# Patient Record
Sex: Male | Born: 1935 | Race: Black or African American | Hispanic: No | Smoking: Light tobacco smoker
Health system: Southern US, Community
[De-identification: ages and names within clinical notes are randomized; demographics above are authoritative.]

## PROBLEM LIST (undated history)

## (undated) DIAGNOSIS — C61 Malignant neoplasm of prostate: Secondary | ICD-10-CM

## (undated) DIAGNOSIS — E119 Type 2 diabetes mellitus without complications: Secondary | ICD-10-CM

## (undated) HISTORY — PX: SP ENDOVASC REPAIR ILIAC ART: HXRAD400

## (undated) HISTORY — PX: ABDOMINAL AORTIC ANEURYSM REPAIR: SHX42

## (undated) HISTORY — PX: INGUINAL HERNIA REPAIR: SUR1180

---

## 2013-08-12 ENCOUNTER — Inpatient Hospital Stay (HOSPITAL_COMMUNITY)
Admission: EM | Admit: 2013-08-12 | Discharge: 2013-08-22 | DRG: 389 | Disposition: A | Discharge status: Skilled Nursing Facility | Payer: Medicare Other | Attending: Oncology | Admitting: Oncology

## 2013-08-12 ENCOUNTER — Emergency Department (HOSPITAL_COMMUNITY): Payer: Medicare Other

## 2013-08-12 ENCOUNTER — Encounter (HOSPITAL_COMMUNITY): Payer: Self-pay | Admitting: Emergency Medicine

## 2013-08-12 DIAGNOSIS — E872 Acidosis, unspecified: Secondary | ICD-10-CM | POA: Diagnosis present

## 2013-08-12 DIAGNOSIS — K56609 Unspecified intestinal obstruction, unspecified as to partial versus complete obstruction: Principal | ICD-10-CM | POA: Diagnosis present

## 2013-08-12 DIAGNOSIS — E876 Hypokalemia: Secondary | ICD-10-CM | POA: Diagnosis not present

## 2013-08-12 DIAGNOSIS — N179 Acute kidney failure, unspecified: Secondary | ICD-10-CM | POA: Diagnosis present

## 2013-08-12 DIAGNOSIS — E119 Type 2 diabetes mellitus without complications: Secondary | ICD-10-CM | POA: Diagnosis present

## 2013-08-12 DIAGNOSIS — C7952 Secondary malignant neoplasm of bone marrow: Secondary | ICD-10-CM

## 2013-08-12 DIAGNOSIS — C61 Malignant neoplasm of prostate: Secondary | ICD-10-CM

## 2013-08-12 DIAGNOSIS — R310 Gross hematuria: Secondary | ICD-10-CM | POA: Diagnosis not present

## 2013-08-12 DIAGNOSIS — E860 Dehydration: Secondary | ICD-10-CM | POA: Diagnosis present

## 2013-08-12 DIAGNOSIS — N390 Urinary tract infection, site not specified: Secondary | ICD-10-CM | POA: Diagnosis present

## 2013-08-12 DIAGNOSIS — Y69 Unspecified misadventure during surgical and medical care: Secondary | ICD-10-CM | POA: Diagnosis not present

## 2013-08-12 DIAGNOSIS — Z881 Allergy status to other antibiotic agents status: Secondary | ICD-10-CM

## 2013-08-12 DIAGNOSIS — R196 Halitosis: Secondary | ICD-10-CM

## 2013-08-12 DIAGNOSIS — R000 Tachycardia, unspecified: Secondary | ICD-10-CM | POA: Diagnosis present

## 2013-08-12 DIAGNOSIS — T8389XA Other specified complication of genitourinary prosthetic devices, implants and grafts, initial encounter: Secondary | ICD-10-CM | POA: Diagnosis not present

## 2013-08-12 DIAGNOSIS — C7951 Secondary malignant neoplasm of bone: Secondary | ICD-10-CM | POA: Diagnosis present

## 2013-08-12 DIAGNOSIS — F17200 Nicotine dependence, unspecified, uncomplicated: Secondary | ICD-10-CM | POA: Diagnosis present

## 2013-08-12 DIAGNOSIS — R5381 Other malaise: Secondary | ICD-10-CM | POA: Diagnosis present

## 2013-08-12 DIAGNOSIS — D649 Anemia, unspecified: Secondary | ICD-10-CM | POA: Diagnosis present

## 2013-08-12 HISTORY — DX: Type 2 diabetes mellitus without complications: E11.9

## 2013-08-12 HISTORY — DX: Malignant neoplasm of prostate: C61

## 2013-08-12 LAB — CBC WITH DIFFERENTIAL/PLATELET
BASOS ABS: 0 10*3/uL (ref 0.0–0.1)
Basophils Relative: 0 % (ref 0–1)
EOS ABS: 0.2 10*3/uL (ref 0.0–0.7)
Eosinophils Relative: 3 % (ref 0–5)
HCT: 38.1 % — ABNORMAL LOW (ref 39.0–52.0)
Hemoglobin: 12.2 g/dL — ABNORMAL LOW (ref 13.0–17.0)
LYMPHS ABS: 1 10*3/uL (ref 0.7–4.0)
LYMPHS PCT: 16 % (ref 12–46)
MCH: 26.3 pg (ref 26.0–34.0)
MCHC: 32 g/dL (ref 30.0–36.0)
MCV: 82.1 fL (ref 78.0–100.0)
Monocytes Absolute: 0.4 10*3/uL (ref 0.1–1.0)
Monocytes Relative: 7 % (ref 3–12)
NEUTROS PCT: 74 % (ref 43–77)
Neutro Abs: 4.5 10*3/uL (ref 1.7–7.7)
Platelets: 405 10*3/uL — ABNORMAL HIGH (ref 150–400)
RBC: 4.64 MIL/uL (ref 4.22–5.81)
RDW: 16.7 % — AB (ref 11.5–15.5)
WBC: 6 10*3/uL (ref 4.0–10.5)

## 2013-08-12 LAB — URINALYSIS, ROUTINE W REFLEX MICROSCOPIC
GLUCOSE, UA: NEGATIVE mg/dL
Ketones, ur: 15 mg/dL — AB
Nitrite: NEGATIVE
PH: 5 (ref 5.0–8.0)
PROTEIN: 100 mg/dL — AB
SPECIFIC GRAVITY, URINE: 1.026 (ref 1.005–1.030)
Urobilinogen, UA: 1 mg/dL (ref 0.0–1.0)

## 2013-08-12 LAB — COMPREHENSIVE METABOLIC PANEL
ALBUMIN: 2.7 g/dL — AB (ref 3.5–5.2)
ALK PHOS: 335 U/L — AB (ref 39–117)
ALT: 6 U/L (ref 0–53)
AST: 27 U/L (ref 0–37)
BUN: 20 mg/dL (ref 6–23)
CO2: 22 mEq/L (ref 19–32)
Calcium: 9.2 mg/dL (ref 8.4–10.5)
Chloride: 99 mEq/L (ref 96–112)
Creatinine, Ser: 1.22 mg/dL (ref 0.50–1.35)
GFR calc non Af Amer: 50 mL/min — ABNORMAL LOW (ref >90)
GFR, EST AFRICAN AMERICAN: 57 mL/min — AB (ref >90)
GLUCOSE: 140 mg/dL — AB (ref 70–99)
POTASSIUM: 3.6 meq/L — AB (ref 3.7–5.3)
Sodium: 141 mEq/L (ref 137–147)
Total Bilirubin: 0.4 mg/dL (ref 0.3–1.2)
Total Protein: 7.9 g/dL (ref 6.0–8.3)

## 2013-08-12 LAB — I-STAT CG4 LACTIC ACID, ED: Lactic Acid, Venous: 2.36 mmol/L — ABNORMAL HIGH (ref 0.5–2.2)

## 2013-08-12 LAB — URINE MICROSCOPIC-ADD ON

## 2013-08-12 LAB — I-STAT TROPONIN, ED: Troponin i, poc: 0.02 ng/mL (ref 0.00–0.08)

## 2013-08-12 LAB — TYPE AND SCREEN
ABO/RH(D): A POS
Antibody Screen: NEGATIVE

## 2013-08-12 LAB — ABO/RH: ABO/RH(D): A POS

## 2013-08-12 LAB — PROTIME-INR
INR: 1.24 (ref 0.00–1.49)
PROTHROMBIN TIME: 15.3 s — AB (ref 11.6–15.2)

## 2013-08-12 LAB — POC OCCULT BLOOD, ED: FECAL OCCULT BLD: NEGATIVE

## 2013-08-12 LAB — APTT: aPTT: 46 seconds — ABNORMAL HIGH (ref 24–37)

## 2013-08-12 MED ORDER — IOHEXOL 300 MG/ML  SOLN
25.0000 mL | INTRAMUSCULAR | Status: AC
  Administered 2013-08-12: 25 mL via ORAL

## 2013-08-12 MED ORDER — DEXTROSE 5 % IV SOLN
1.0000 g | INTRAVENOUS | Status: DC
  Administered 2013-08-13 – 2013-08-15 (×3): 1 g via INTRAVENOUS
  Filled 2013-08-12 (×4): qty 10

## 2013-08-12 MED ORDER — ONDANSETRON HCL 4 MG/2ML IJ SOLN
4.0000 mg | Freq: Four times a day (QID) | INTRAMUSCULAR | Status: DC | PRN
  Administered 2013-08-18: 4 mg via INTRAVENOUS
  Filled 2013-08-12: qty 2

## 2013-08-12 MED ORDER — IOHEXOL 300 MG/ML  SOLN
80.0000 mL | Freq: Once | INTRAMUSCULAR | Status: AC | PRN
  Administered 2013-08-12: 80 mL via INTRAVENOUS

## 2013-08-12 MED ORDER — HEPARIN SODIUM (PORCINE) 5000 UNIT/ML IJ SOLN
5000.0000 [IU] | Freq: Three times a day (TID) | INTRAMUSCULAR | Status: DC
  Administered 2013-08-12 – 2013-08-22 (×29): 5000 [IU] via SUBCUTANEOUS
  Filled 2013-08-12 (×32): qty 1

## 2013-08-12 MED ORDER — CEFTRIAXONE SODIUM 1 G IJ SOLR
1.0000 g | Freq: Once | INTRAMUSCULAR | Status: AC
  Administered 2013-08-12: 1 g via INTRAVENOUS
  Filled 2013-08-12: qty 10

## 2013-08-12 MED ORDER — SODIUM CHLORIDE 0.9 % IV SOLN
INTRAVENOUS | Status: AC
  Administered 2013-08-12: 100 mL/h via INTRAVENOUS

## 2013-08-12 MED ORDER — SODIUM CHLORIDE 0.9 % IV BOLUS (SEPSIS)
500.0000 mL | Freq: Once | INTRAVENOUS | Status: AC
  Administered 2013-08-12: 500 mL via INTRAVENOUS

## 2013-08-12 MED ORDER — SODIUM CHLORIDE 0.9 % IJ SOLN
3.0000 mL | Freq: Two times a day (BID) | INTRAMUSCULAR | Status: DC
  Administered 2013-08-13 – 2013-08-22 (×9): 3 mL via INTRAVENOUS

## 2013-08-12 MED ORDER — FENTANYL CITRATE 0.05 MG/ML IJ SOLN: 12.5000 ug | INTRAMUSCULAR | Status: DC | PRN

## 2013-08-12 NOTE — ED Notes (Signed)
Admit Doctor at bedside.  

## 2013-08-12 NOTE — ED Provider Notes (Signed)
CSN: [id number]     Arrival date & time 08/12/13  1012 History   First MD Initiated Contact with Patient 08/12/13 1013     Chief Complaint  Patient presents with  . Emesis  . Fatigue     (Consider location/radiation/quality/duration/timing/severity/associated sxs/prior Treatment) The history is provided by the patient and medical records.   [name] with past medical history significant for diabetes and prostate cancer currently on Lupron and PO chemotherapy, presenting to the ED for fatigue and emesis. Per family, the patient has not been eating over the last day and has had one episode of dark brown emesis with concern for bleeding. Family notes he has also appeared fatigued. Patient states he does feel generally weak and has no energy to move around. He states he does not have an appetite.  He denies any chest pain, SOB, palpitations, dizziness, numbness/paresthesias.  No abdominal pain.  No recent medication changes.  No cough, sore throat, fevers, chills, or other URI symptoms.  Per pt he does not have a living will (eldest son handles most of his affairs) and desires to be full code.  Past Medical History  Diagnosis Date  . Diabetes mellitus without complication   . Prostate cancer    History reviewed. No pertinent past surgical history. No family history on file. History  Substance Use Topics  . Smoking status: Light Tobacco Smoker  . Smokeless tobacco: Not on file  . Alcohol Use: No    Review of Systems  Constitutional: Positive for fatigue.  Gastrointestinal: Positive for vomiting.  All other systems reviewed and are negative.     Allergies  Review of patient's allergies indicates no known allergies.  Home Medications   Prior to Admission medications   Not on File   BP 119/72  Temp(Src) 97.5 F (36.4 C) (Rectal)  Resp 25  Ht 5\' 7"  (1.702 m)  Wt 130 lb (58.968 kg)  BMI 20.36 kg/m2  SpO2 100%  Physical Exam  Nursing note and vitals  reviewed. Constitutional: He is oriented to person, place, and time. No distress.  Thin, elderly  HENT:  Head: Normocephalic and atraumatic.  Mouth/Throat: Uvula is midline and oropharynx is clear and moist. Mucous membranes are dry. No oropharyngeal exudate, posterior oropharyngeal edema, posterior oropharyngeal erythema or tonsillar abscesses.  Mucous membranes dry; no blood present in oropharynx  Eyes: Conjunctivae and EOM are normal. Pupils are equal, round, and reactive to light.  Neck: Normal range of motion. Neck supple.  Cardiovascular: Normal rate, regular rhythm and normal heart sounds.   Pulmonary/Chest: Effort normal and breath sounds normal. No respiratory distress. He has no wheezes.  Abdominal: Soft. Bowel sounds are normal. There is no tenderness. There is no guarding.  Genitourinary: Rectum normal. Guaiac negative stool.  Indwelling foley; yellow urine in leg bag Normal rectal tone; light brown stool on glove without gross blood; no external hemorrhoids or masses; guaiac negative  Musculoskeletal: Normal range of motion. He exhibits no edema.  Neurological: He is alert and oriented to person, place, and time.  Skin: Skin is warm and dry. He is not diaphoretic.  Psychiatric: He has a normal mood and affect.    ED Course  Procedures (including critical care time) Labs Review Labs Reviewed  PROTIME-INR - Abnormal; Notable for the following:    Prothrombin Time 15.3 (*)    All other components within normal limits  APTT - Abnormal; Notable for the following:    aPTT 46 (*)  All other components within normal limits  CBC WITH DIFFERENTIAL - Abnormal; Notable for the following:    Hemoglobin 12.2 (*)    HCT 38.1 (*)    RDW 16.7 (*)    Platelets 405 (*)    All other components within normal limits  COMPREHENSIVE METABOLIC PANEL - Abnormal; Notable for the following:    Potassium 3.6 (*)    Glucose, Bld 140 (*)    Albumin 2.7 (*)    Alkaline Phosphatase 335 (*)     GFR calc non Af Amer 50 (*)    GFR calc Af Amer 57 (*)    All other components within normal limits  URINALYSIS, ROUTINE W REFLEX MICROSCOPIC - Abnormal; Notable for the following:    Color, Urine AMBER (*)    APPearance TURBID (*)    Hgb urine dipstick LARGE (*)    Bilirubin Urine SMALL (*)    Ketones, ur 15 (*)    Protein, ur 100 (*)    Leukocytes, UA MODERATE (*)    All other components within normal limits  URINE MICROSCOPIC-ADD ON - Abnormal; Notable for the following:    Bacteria, UA MANY (*)    Casts HYALINE CASTS (*)    All other components within normal limits  I-STAT CG4 LACTIC ACID, ED - Abnormal; Notable for the following:    Lactic Acid, Venous 2.36 (*)    All other components within normal limits  URINE CULTURE  POC OCCULT BLOOD, ED  I-STAT TROPOININ, ED  TYPE AND SCREEN  ABO/RH    Imaging Review Dg Chest 2 View  08/12/2013   CLINICAL DATA:  Emesis and fatigue.  EXAM: CHEST  2 VIEW  COMPARISON:  None.  FINDINGS: Evaluation of the mediastinum is mildly limited by patient rotation. The cardiac silhouette does not appear enlarged. Thoracic aortic calcification is noted. The lungs are hypoinflated with predominantly linear opacities in the lung bases. A few small nodular opacities are present in both lungs. The most prominent rounded opacity is in the right lung base and could represent nipple shadow. There is blunting of the right costophrenic angle, and a small right pleural effusion is not excluded. No pneumothorax is identified.  There is a large air-fluid level under the diaphragm in the left upper quadrant. There is also mild gaseous distension of multiple bowel loops in the upper abdomen, incompletely imaged. Aortic stent graft is partially visualized. No acute osseous abnormality is identified.  IMPRESSION: 1. Hypoinflation with bibasilar lung opacities, most likely reflecting subsegmental atelectasis although developing infection is not excluded. 2. Scattered, nodular  lung opacities. Follow-up formal upright PA and lateral radiographs are suggested when the patient's acute illness has improved and he is able to take a larger inspiration, with consideration for subsequent chest CT. 3. Large air-fluid level in the left upper quadrant, favored to reflect distended stomach, with adjacent mildly distended bowel loops. Dedicated abdominal radiographs are recommended.   Electronically Signed   By: Logan Bores   On: 08/12/2013 11:30   Ct Abdomen Pelvis W Contrast  08/12/2013   CLINICAL DATA:  Dilated bowel loops and vomiting. Fatigue. Rule out small bowel obstruction.  EXAM: CT ABDOMEN AND PELVIS WITH CONTRAST  TECHNIQUE: Multidetector CT imaging of the abdomen and pelvis was performed using the standard protocol following bolus administration of intravenous contrast.  CONTRAST:  51mL OMNIPAQUE IOHEXOL 300 MG/ML  SOLN  COMPARISON:  None.  FINDINGS: Mild subpleural ground-glass and reticular opacities are present in the lung bases, which may  reflect atelectasis although a component of interstitial lung disease is also a consideration. Calcified pleural plaques are partially visualized in the posterior lungs bilaterally. There is no pleural effusion. Three-vessel coronary artery calcifications are present.  Enhancement of the liver is mildly heterogeneous without focal abnormality identified, possibly due to contrast timing. The spleen is also minimally heterogeneous in attenuation, also likely related to contrast timing. The gallbladder, adrenal glands, and right kidney are unremarkable. 2 cm left upper pole renal cyst is noted. Additional subcentimeter left renal hypodensities also likely represent cysts but are too small to fully characterize. The pancreas is diffusely atrophic.  There is marked distention of the stomach with a large air-fluid level present. The duodenum does not cross the midline, however this may be due to displacement by the large abdominal aortic aneurysm. The  majority of the small bowel is fluid-filled and dilated, measuring up to 5 cm in diameter. There is a transition to decompressed distal small bowel in the right lower quadrant, although the precise site of transition is not clearly identified. The colon contains a small amount of stool and is largely decompressed. There is a small amount of intraperitoneal free fluid.  No intraperitoneal free air is identified. A Foley catheter is in place. The prostate is diffusely enlarged and heterogeneous with areas of nodular enhancement present. There is been prior endovascular repair of an abdominal aortic aneurysm with an aorto bi-iliac stent graft. The graft is patent. The native aneurysm sac measures 6.6 x 5.9 cm. The native right common iliac artery aneurysm sac measures 4.2 cm in diameter. There has been prior right internal iliac artery embolization. 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 Sclerotic lesions are present diffusely throughout the visualized ribs, spine, and pelvis.  IMPRESSION: 1. Evidence of high-grade small bowel obstruction with transition to decompressed distal small bowel in the right lower quadrant. 2. Small amount of intraperitoneal free fluid. 3. Enlarged, heterogeneous prostate with diffuse sclerotic bone lesions, consistent with metastatic prostate cancer. 4. Partially calcified pleural plaques, suggestive of prior asbestos exposure. These may be the etiology for the nodular opacities seen on recent chest radiographs. 5. 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   Electronically Signed   By: Logan Bores   On: 08/12/2013 13:31     EKG Interpretation None      MDM   Final diagnoses:  Small bowel obstruction  UTI (lower urinary tract infection)  Prostate cancer   EKG sinus tachycardia rate of 111 without ischemic change.  Troponin  is negative.  Chest x-ray with questionable nodular opacities, distended stomach visualized. CT scan was obtained which shows evidence of high-grade small bowel obstruction with transition point in right lower quadrant.  He continues to deny any chest pain, shortness of breath, palpitations, dizziness, weakness, or abdominal pain. He's had no further episodes of vomiting while in the emergency department.  U/a does appear infected with many bacteria, culture pending. Pt given dose of IV rocephin.  Pt was initially tachycardic on arrival, considered PE given active cancer however pt denies chest pain, is not hypoxic and shows no signs of respiratory distress.  HR is responding well to fluids, BP remains stable.  Pt will likely need medical admission with general surgery consult.    Discussed with general surgery who will consult.  NG tube being placed.  Discussed with IM resident, Dr. Michail Sermon  who will evaluate pt in the ED and admit for further management.  3:38 PM Went in to evaluate patient prior to end of shift and noticed that pts HR has now elevated again, now at 118 when was previously responding to fluids.  He continues to be without chest pain or respiratory distress but given his active prostate cancer will obtain chest CTA to r/o PE.  Updated IM resident, Dr. Michail Sermon who agrees with this plan.  Larene Pickett, PA-C 08/12/13 [place], PA-C 08/12/13 [phone]

## 2013-08-12 NOTE — Consult Note (Signed)
Brandon Harrell [birth year]  [id number].    Requesting MD: Dr. Stark Jock Chief Complaint/Reason for Consult: High grade SBO  HPI:  80 y/o AA male with PMH significant for DM and prostate cancer currently on Lupron and PO chemotherapy drug, presenting to [facility] for fatigue and emesis.  The patient notes anorexia, fatigue, weakness, and 2 episode of dark brown emesis.  Not sure if it had blood in it, but non bilious.  He vomited once last night and once this am.  He denies any chest pain, SOB, palpitations, dizziness.  No abdominal pain, trouble urinating or constipation/diarrhea.  No cough, sore throat, fevers, chills, or other URI symptoms.  No sick contacts, denies feeling ill until yesterday when this all started.  He can't remember his last BM, but thinks he's been passing flatus normally.  He doesn't complain of abdominal pain, but hurts when you push on it.  He denies any h/o of bowel obstruction.  PSH positive for Endovascular aortic and iliac stent graph, b/l open inguinal hernia repairs.  Notes weight loss over the last few month (used to be 160lbs, no 130lbs).  Son notes the patient had a procedure where "they cleaned out his bladder so he could urinate" because the prostate cancer has spread to his bladder.   ROS: All systems reviewed and otherwise negative except for as above  No family history on file.  Past Medical History  Diagnosis Date  . Diabetes mellitus without complication   . Prostate cancer     History reviewed. No pertinent past surgical history.  Social History:  reports that he has been smoking.  He does not have any smokeless tobacco history on file. He reports that he does not drink alcohol or use illicit drugs.  Allergies:  Allergies  Allergen Reactions  . Ciprofloxacin Itching     (Not in a hospital admission)  Blood pressure 106/58, pulse 116, temperature 97.5 F (36.4 C), temperature source Rectal, resp. rate 20, height _0  (1.702 m), weight 130 lb (58.968 kg),  SpO2 97.00%. Physical Exam: General: pleasant, WD, appears malnourished, AA male who is laying in bed in NAD HEENT: head is normocephalic, atraumatic.  Sclera are noninjected.  PERRL.  Ears and nose without any masses or lesions.  Mouth is pink and moist.  Dentures are missing. Heart: regular, rate, and rhythm.  +murmur, no gallops, or rubs noted.  Palpable pedal pulses bilaterally Lungs: CTAB, no wheezes, rhonchi, or rales noted.  Respiratory effort nonlabored Abd: soft, moderate tenderness in the RLQ, no rebound or guarding, +BS, no masses, hernias, or organomegaly MS: all 4 extremities are symmetrical with no cyanosis, clubbing, or edema. Skin: warm and dry with no masses, lesions, or rashes Psych: A&Ox3 with an appropriate affect   Results for orders placed during the hospital encounter of 08/12/13 (from the past 48 hour(s))  POC OCCULT BLOOD, ED     Status: None   Collection Time    08/12/13 10:29 AM      Result Value Ref Range   Fecal Occult Bld NEGATIVE  NEGATIVE  PROTIME-INR     Status: Abnormal   Collection Time    08/12/13 10:50 AM      Result Value Ref Range   Prothrombin Time 15.3 (*) 11.6 - 15.2 seconds   INR 1.24  0.00 - 1.49  APTT     Status: Abnormal   Collection Time    08/12/13 10:50 AM      Result Value Ref Range   aPTT 46 (*)  24 - 37 seconds   Comment:            IF BASELINE aPTT IS ELEVATED,     SUGGEST PATIENT RISK ASSESSMENT     BE USED TO DETERMINE APPROPRIATE     ANTICOAGULANT THERAPY.  CBC WITH DIFFERENTIAL     Status: Abnormal   Collection Time    08/12/13 10:50 AM      Result Value Ref Range   WBC 6.0  4.0 - 10.5 K/uL   RBC 4.64  4.22 - 5.81 MIL/uL   Hemoglobin 12.2 (*) 13.0 - 17.0 g/dL   HCT 38.1 (*) 39.0 - 52.0 %   MCV 82.1  78.0 - 100.0 fL   MCH 26.3  26.0 - 34.0 pg   MCHC 32.0  30.0 - 36.0 g/dL   RDW 16.7 (*) 11.5 - 15.5 %   Platelets 405 (*) 150 - 400 K/uL   Neutrophils Relative % 74  43 - 77 %   Neutro Abs 4.5  1.7 - 7.7 K/uL    Lymphocytes Relative 16  12 - 46 %   Lymphs Abs 1.0  0.7 - 4.0 K/uL   Monocytes Relative 7  3 - 12 %   Monocytes Absolute 0.4  0.1 - 1.0 K/uL   Eosinophils Relative 3  0 - 5 %   Eosinophils Absolute 0.2  0.0 - 0.7 K/uL   Basophils Relative 0  0 - 1 %   Basophils Absolute 0.0  0.0 - 0.1 K/uL  COMPREHENSIVE METABOLIC PANEL     Status: Abnormal   Collection Time    08/12/13 10:50 AM      Result Value Ref Range   Sodium 141  137 - 147 mEq/L   Potassium 3.6 (*) 3.7 - 5.3 mEq/L   Chloride 99  96 - 112 mEq/L   CO2 22  19 - 32 mEq/L   Glucose, Bld 140 (*) 70 - 99 mg/dL   BUN 20  6 - 23 mg/dL   Creatinine, Ser 1.22  0.50 - 1.35 mg/dL   Calcium 9.2  8.4 - 10.5 mg/dL   Total Protein 7.9  6.0 - 8.3 g/dL   Albumin 2.7 (*) 3.5 - 5.2 g/dL   AST 27  0 - 37 U/L   ALT 6  0 - 53 U/L   Alkaline Phosphatase 335 (*) 39 - 117 U/L   Total Bilirubin 0.4  0.3 - 1.2 mg/dL   GFR calc non Af Amer 50 (*) >90 mL/min   GFR calc Af Amer 57 (*) >90 mL/min   Comment: (NOTE)     The eGFR has been calculated using the CKD EPI equation.     This calculation has not been validated in all clinical situations.     eGFR's persistently <90 mL/min signify possible Chronic Kidney     Disease.  TYPE AND SCREEN     Status: None   Collection Time    08/12/13 10:50 AM      Result Value Ref Range   ABO/RH(D) A POS     Antibody Screen NEG     Sample Expiration 08/15/2013    I-STAT TROPOININ, ED     Status: None   Collection Time    08/12/13 10:58 AM      Result Value Ref Range   Troponin i, poc 0.02  0.00 - 0.08 ng/mL   Comment 3            Comment: Due to the release kinetics of  cTnI,     a negative result within the first hours     of the onset of symptoms does not rule out     myocardial infarction with certainty.     If myocardial infarction is still suspected,     repeat the test at appropriate intervals.  URINALYSIS, ROUTINE W REFLEX MICROSCOPIC     Status: Abnormal   Collection Time    08/12/13 11:25 AM       Result Value Ref Range   Color, Urine AMBER (*) YELLOW   Comment: BIOCHEMICALS MAY BE AFFECTED BY COLOR   APPearance TURBID (*) CLEAR   Specific Gravity, Urine 1.026  1.005 - 1.030   pH 5.0  5.0 - 8.0   Glucose, UA NEGATIVE  NEGATIVE mg/dL   Hgb urine dipstick LARGE (*) NEGATIVE   Bilirubin Urine SMALL (*) NEGATIVE   Ketones, ur 15 (*) NEGATIVE mg/dL   Protein, ur 100 (*) NEGATIVE mg/dL   Urobilinogen, UA 1.0  0.0 - 1.0 mg/dL   Nitrite NEGATIVE  NEGATIVE   Leukocytes, UA MODERATE (*) NEGATIVE  URINE MICROSCOPIC-ADD ON     Status: Abnormal   Collection Time    08/12/13 11:25 AM      Result Value Ref Range   Squamous Epithelial / LPF RARE  RARE   WBC, UA 11-20  <3 WBC/hpf   RBC / HPF 21-50  <3 RBC/hpf   Bacteria, UA MANY (*) RARE   Casts HYALINE CASTS (*) NEGATIVE  I-STAT CG4 LACTIC ACID, ED     Status: Abnormal   Collection Time    08/12/13  2:32 PM      Result Value Ref Range   Lactic Acid, Venous 2.36 (*) 0.5 - 2.2 mmol/L   Dg Chest 2 View  08/12/2013   CLINICAL DATA:  Emesis and fatigue.  EXAM: CHEST  2 VIEW  COMPARISON:  None.  FINDINGS: Evaluation of the mediastinum is mildly limited by patient rotation. The cardiac silhouette does not appear enlarged. Thoracic aortic calcification is noted. The lungs are hypoinflated with predominantly linear opacities in the lung bases. A few small nodular opacities are present in both lungs. The most prominent rounded opacity is in the right lung base and could represent nipple shadow. There is blunting of the right costophrenic angle, and a small right pleural effusion is not excluded. No pneumothorax is identified.  There is a large air-fluid level under the diaphragm in the left upper quadrant. There is also mild gaseous distension of multiple bowel loops in the upper abdomen, incompletely imaged. Aortic stent graft is partially visualized. No acute osseous abnormality is identified.  IMPRESSION: 1. Hypoinflation with bibasilar lung  opacities, most likely reflecting subsegmental atelectasis although developing infection is not excluded. 2. Scattered, nodular lung opacities. Follow-up formal upright PA and lateral radiographs are suggested when the patient's acute illness has improved and he is able to take a larger inspiration, with consideration for subsequent chest CT. 3. Large air-fluid level in the left upper quadrant, favored to reflect distended stomach, with adjacent mildly distended bowel loops. Dedicated abdominal radiographs are recommended.   Electronically Signed   By: Logan Bores   On: 08/12/2013 11:30   Ct Abdomen Pelvis W Contrast  08/12/2013   CLINICAL DATA:  Dilated bowel loops and vomiting. Fatigue. Rule out small bowel obstruction.  EXAM: CT ABDOMEN AND PELVIS WITH CONTRAST  TECHNIQUE: Multidetector CT imaging of the abdomen and pelvis was performed using the standard protocol following bolus administration of  intravenous contrast.  CONTRAST:  9m OMNIPAQUE IOHEXOL 300 MG/ML  SOLN  COMPARISON:  None.  FINDINGS: Mild subpleural ground-glass and reticular opacities are present in the lung bases, which may reflect atelectasis although a component of interstitial lung disease is also a consideration. Calcified pleural plaques are partially visualized in the posterior lungs bilaterally. There is no pleural effusion. Three-vessel coronary artery calcifications are present.  Enhancement of the liver is mildly heterogeneous without focal abnormality identified, possibly due to contrast timing. The spleen is also minimally heterogeneous in attenuation, also likely related to contrast timing. The gallbladder, adrenal glands, and right kidney are unremarkable. 2 cm left upper pole renal cyst is noted. Additional subcentimeter left renal hypodensities also likely represent cysts but are too small to fully characterize. The pancreas is diffusely atrophic.  There is marked distention of the stomach with a large air-fluid level  present. The duodenum does not cross the midline, however this may be due to displacement by the large abdominal aortic aneurysm. The majority of the small bowel is fluid-filled and dilated, measuring up to 5 cm in diameter. There is a transition to decompressed distal small bowel in the right lower quadrant, although the precise site of transition is not clearly identified. The colon contains a small amount of stool and is largely decompressed. There is a small amount of intraperitoneal free fluid.  No intraperitoneal free air is identified. A Foley catheter is in place. The prostate is diffusely enlarged and heterogeneous with areas of nodular enhancement present. There is been prior endovascular repair of an abdominal aortic aneurysm with an aorto bi-iliac stent graft. The graft is patent. The native aneurysm sac measures 6.6 x 5.9 cm. The native right common iliac artery aneurysm sac measures 4.2 cm in diameter. There has been prior right internal iliac artery embolization. 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 Sclerotic lesions are present diffusely throughout the visualized ribs, spine, and pelvis.  IMPRESSION: 1. Evidence of high-grade small bowel obstruction with transition to decompressed distal small bowel in the right lower quadrant. 2. Small amount of intraperitoneal free fluid. 3. Enlarged, heterogeneous prostate with diffuse sclerotic bone lesions, consistent with metastatic prostate cancer. 4. Partially calcified pleural plaques, suggestive of prior asbestos exposure. These may be the etiology for the nodular opacities seen on recent chest radiographs. 5. 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   Electronically Signed   By: ALogan Bores  On: 08/12/2013 13:31      Assessment/Plan Small bowel obstruction (?high grade) Prostate cancer with  h/o urinary obstruction Diabetes Multiple medical problems to be managed by medicine   Plan: 1.  Admit to Medicine, we will consult, treat conservatively for now, hopefully this will resolve on its own without surgical intervention 2.  Place NG tube, NPO, bowel rest, IVF, pain control, antiemetics 3.  SCD's and DVT proph per medicine 4.  Ambulate and IS to aid in recovery 5.  Son at bedside and patient are agreeable to the plan and wish to avoid surgery if at all possible   MCoralie Keens PProvidence Holy Cross Medical CenterSurgery 08/12/2013, 3:11 PM Pager: [phone]

## 2013-08-12 NOTE — Progress Notes (Signed)
Pt admitted to unit from ED. Pt A&O x3, VS stable, & skin intact. Pt placed on telemetry, oriented to unit, and call bell within reach. Pt currently resting comfortably in bed. Will continue to monitor.

## 2013-08-12 NOTE — H&P (Signed)
Date: 08/12/2013               Patient Name:  Brandon Harrell MRN: [id number]  DOB: 1919/07/14 Age / Sex: 80 y.o., male   PCP: No Pcp Per Patient         Medical Service: Internal Medicine Teaching Service         Attending Physician: Dr. Veryl Speak, MD    First Contact: Dr. [name] Pager: [phone]  Second Contact: Dr. Clinton Gallant Pager: [phone]       After Hours (After 5p/  First Contact Pager: [phone]  weekends / holidays): Second Contact Pager: [phone]   Chief Complaint: decreased po and N/V  History of Present Illness: Brandon Harrell is  80 year old male with a PMH of DM and met prostate CA (on Lupron and Zytiga) who presents with complaint decreased po and episode of dark brown vomiting.  History is provided by the patient and his son.  Has not eaten in about 4 days.  1 episode of dark brown vomiting last night.  His son says he did not complain of abdominal pain.  He denies constipation/diarrhea and says his last BM was yesterday.  He denies F/C, chest pain, dyspnea or dysuria.  In the ED:  T 97.9F, HR 25, SpO2 100%, HR 100-110s, BP 113/49mHg; ceftriaxone and NS given; surgery consulted - NG placed.  Meds: Current Facility-Administered Medications  Medication Dose Route Frequency Provider Last Rate Last Dose  . fentaNYL (SUBLIMAZE) injection 12.5-50 mcg  12.5-50 mcg Intravenous Q3H PRN Megan Dort, PA-C      . ondansetron (ZOFRAN) injection 4 mg  4 mg Intravenous Q6H PRN MCoralie Keens PA-C       Current Outpatient Prescriptions  Medication Sig Dispense Refill  . abiraterone Acetate (ZYTIGA) 250 MG tablet Take 1,000 mg by mouth daily. Take on an empty stomach 1 hour before or 2 hours after a meal      . HYDROcodone-acetaminophen (NORCO/VICODIN) 5-325 MG per tablet Take 1 tablet by mouth 2 (two) times daily as needed for moderate pain.      .Marland Kitchenlevofloxacin (LEVAQUIN) 750 MG tablet Take 750 mg by mouth daily.      .Marland KitchenLORazepam (ATIVAN) 0.5 MG tablet Take 0.5 mg by mouth at bedtime as  needed for anxiety or sleep.      . nateglinide (STARLIX) 60 MG tablet Take 60 mg by mouth 3 (three) times daily with meals.        Allergies: Allergies as of 08/12/2013 - Review Complete 08/12/2013  Allergen Reaction Noted  . Ciprofloxacin Itching 08/12/2013   Past Medical History  Diagnosis Date  . Diabetes mellitus without complication   . Prostate cancer    Past Surgical History  Procedure Laterality Date  . Abdominal aortic aneurysm repair    . Sp endovasc repair iliac art    . Inguinal hernia repair Bilateral     Open   No family history on file. History   Social History  . Marital Status: Widowed    Spouse Name: N/A    Number of Children: N/A  . Years of Education: N/A   Occupational History  . Not on file.   Social History Main Topics  . Smoking status: Light Tobacco Smoker  . Smokeless tobacco: Not on file  . Alcohol Use: No  . Drug Use: No  . Sexual Activity: Not on file   Other Topics Concern  . Not on file   Social History Narrative  .  No narrative on file    Review of Systems: Pertinent items are noted in HPI.  Physical Exam: Blood pressure 102/55, pulse 117, temperature 97.5 F (36.4 C), temperature source Rectal, resp. rate 20, height 5' 7" (1.702 m), weight 130 lb (58.968 kg), SpO2 97.00%. General: resting in bed in NAD HEENT: left pupil < right, round and reactive to light, EOMI, oropharynx dry, NG tube in place with red/brownish drainage Cardiac: RRR, no rubs, murmurs or gallops Pulm: clear to auscultation bilaterally, moving normal volumes of air Abd: soft, nondistended, hyperactive BS LUQ and LLQ, decreased BS RUQ and RLQ Ext: warm and well perfused, no pedal edema, 2+ DPs Neuro: alert and oriented X3, responding appropriately, cranial nerves II-XII grossly intact, muscle strength equal and intact  Lab results: Basic Metabolic Panel:  Recent Labs  08/12/13 1050  NA 141  K 3.6*  CL 99  CO2 22  GLUCOSE 140*  BUN 20  CREATININE  1.22  CALCIUM 9.2   Liver Function Tests:  Recent Labs  08/12/13 1050  AST 27  ALT 6  ALKPHOS 335*  BILITOT 0.4  PROT 7.9  ALBUMIN 2.7*   CBC:  Recent Labs  08/12/13 1050  WBC 6.0  NEUTROABS 4.5  HGB 12.2*  HCT 38.1*  MCV 82.1  PLT 405*   Coagulation:  Recent Labs  08/12/13 1050  LABPROT 15.3*  INR 1.24   Urinalysis:  Recent Labs  08/12/13 1125  COLORURINE AMBER*  LABSPEC 1.026  PHURINE 5.0  GLUCOSEU NEGATIVE  HGBUR LARGE*  BILIRUBINUR SMALL*  KETONESUR 15*  PROTEINUR 100*  UROBILINOGEN 1.0  NITRITE NEGATIVE  LEUKOCYTESUR MODERATE*   Imaging results:  Dg Chest 2 View  08/12/2013   CLINICAL DATA:  Emesis and fatigue.  EXAM: CHEST  2 VIEW  COMPARISON:  None.  FINDINGS: Evaluation of the mediastinum is mildly limited by patient rotation. The cardiac silhouette does not appear enlarged. Thoracic aortic calcification is noted. The lungs are hypoinflated with predominantly linear opacities in the lung bases. A few small nodular opacities are present in both lungs. The most prominent rounded opacity is in the right lung base and could represent nipple shadow. There is blunting of the right costophrenic angle, and a small right pleural effusion is not excluded. No pneumothorax is identified.  There is a large air-fluid level under the diaphragm in the left upper quadrant. There is also mild gaseous distension of multiple bowel loops in the upper abdomen, incompletely imaged. Aortic stent graft is partially visualized. No acute osseous abnormality is identified.  IMPRESSION: 1. Hypoinflation with bibasilar lung opacities, most likely reflecting subsegmental atelectasis although developing infection is not excluded. 2. Scattered, nodular lung opacities. Follow-up formal upright PA and lateral radiographs are suggested when the patient's acute illness has improved and he is able to take a larger inspiration, with consideration for subsequent chest CT. 3. Large air-fluid  level in the left upper quadrant, favored to reflect distended stomach, with adjacent mildly distended bowel loops. Dedicated abdominal radiographs are recommended.   Electronically Signed   By: Logan Bores   On: 08/12/2013 11:30   Ct Abdomen Pelvis W Contrast  08/12/2013   CLINICAL DATA:  Dilated bowel loops and vomiting. Fatigue. Rule out small bowel obstruction.  EXAM: CT ABDOMEN AND PELVIS WITH CONTRAST  TECHNIQUE: Multidetector CT imaging of the abdomen and pelvis was performed using the standard protocol following bolus administration of intravenous contrast.  CONTRAST:  38m OMNIPAQUE IOHEXOL 300 MG/ML  SOLN  COMPARISON:  None.  FINDINGS: Mild subpleural ground-glass and reticular opacities are present in the lung bases, which may reflect atelectasis although a component of interstitial lung disease is also a consideration. Calcified pleural plaques are partially visualized in the posterior lungs bilaterally. There is no pleural effusion. Three-vessel coronary artery calcifications are present.  Enhancement of the liver is mildly heterogeneous without focal abnormality identified, possibly due to contrast timing. The spleen is also minimally heterogeneous in attenuation, also likely related to contrast timing. The gallbladder, adrenal glands, and right kidney are unremarkable. 2 cm left upper pole renal cyst is noted. Additional subcentimeter left renal hypodensities also likely represent cysts but are too small to fully characterize. The pancreas is diffusely atrophic.  There is marked distention of the stomach with a large air-fluid level present. The duodenum does not cross the midline, however this may be due to displacement by the large abdominal aortic aneurysm. The majority of the small bowel is fluid-filled and dilated, measuring up to 5 cm in diameter. There is a transition to decompressed distal small bowel in the right lower quadrant, although the precise site of transition is not clearly  identified. The colon contains a small amount of stool and is largely decompressed. There is a small amount of intraperitoneal free fluid.  No intraperitoneal free air is identified. A Foley catheter is in place. The prostate is diffusely enlarged and heterogeneous with areas of nodular enhancement present. There is been prior endovascular repair of an abdominal aortic aneurysm with an aorto bi-iliac stent graft. The graft is patent. The native aneurysm sac measures 6.6 x 5.9 cm. The native right common iliac artery aneurysm sac measures 4.2 cm in diameter. There has been prior right internal iliac artery embolization. 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 Sclerotic lesions are present diffusely throughout the visualized ribs, spine, and pelvis.  IMPRESSION: 1. Evidence of high-grade small bowel obstruction with transition to decompressed distal small bowel in the right lower quadrant. 2. Small amount of intraperitoneal free fluid. 3. Enlarged, heterogeneous prostate with diffuse sclerotic bone lesions, consistent with metastatic prostate cancer. 4. Partially calcified pleural plaques, suggestive of prior asbestos exposure. These may be the etiology for the nodular opacities seen on recent chest radiographs. 5. 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   Electronically Signed   By: Logan Bores   On: 08/12/2013 13:31    Assessment & Plan by Problem: 80 year old male with a PMH of DM and prostate CA who presents with complaint of decreased po and vomiting x 4 days.  SBO:  CT - High-grade small bowel obstruction w/ transition to decompressed distal small bowel in the RLQ.  Surgery evaluated patient in the ED and recommendations appreciated.   - conservative management:  NG tube placed, NPO, IV fluids (bolus, then maintenance), symptom management - Fentanyl  for pain control, Zofran for nausea - ambulate with PT, IS - surgery following  Tachycardia:  Likely 2/2 to dehydration in the setting of decreased po.  Poc troponin negative.  Patient w/o chest pain or hypoxia but given malignancy and continued tachycardia (Well's 2.5 - moderate risk) CTA ordered to r/o PE. - follow-up CTA results - monitor on telemetry - AM EKG  AG metabolic acidosis:  AG 20, likely 2/2 to lactic acidosis 2.36 in the setting of SBO. - treating SBO as above - monitor lactic acid, BMP  UTI:  Patient asymptomatic. Received ceftriaxone in the ED.  His son reports he has been prescribed antibiotics twice in the past month for UTI.  Most recently prescribed Levaquin for UTI and possible PNA (1 week ago).  - continue ceftriaxone - follow-up urine culture  Anemia:  Hgb 12.2, unknown baseline.  + hematemesis, neg FOBT. - monitor CBC  Prostate Ca with mets to spine:  Currently receiving Lupron and Zytiga.  Urologist is in [place]. - hold above medications while patient NPO - Fentanyl for pain control  Diet:  NPO VTE ppx: SCDs, heparin  Code:  Full  Dispo: Disposition is deferred at this time, awaiting improvement of current medical problems. Anticipated discharge in approximately 1-2 day(s).   The patient does not have a current PCP (No Pcp Per Patient) and does not know need an [facility] hospital follow-up appointment after discharge.  The patient does not know have transportation limitations that hinder transportation to clinic appointments.  Signed: [name], DO 08/12/2013, 3:40 PM

## 2013-08-12 NOTE — Progress Notes (Signed)
ANTIBIOTIC CONSULT NOTE - INITIAL  Pharmacy Consult for Ceftriaxone Indication:UTI  Allergies  Allergen Reactions  . Ciprofloxacin Itching    Patient Measurements: Height: 5\' 7"  (170.2 cm) Weight: 130 lb 3.2 oz (59.058 kg) IBW/kg (Calculated) : 66.1  Vital Signs: Temp: 97.5 F (36.4 C) (05/15 1836) Temp src: Axillary (05/15 1836) BP: 108/67 mmHg (05/15 1836) Pulse Rate: 108 (05/15 1836) Intake/Output from previous day:   Intake/Output from this shift:    Labs:  Recent Labs  08/12/13 1050  WBC 6.0  HGB 12.2*  PLT 405*  CREATININE 1.22   Estimated Creatinine Clearance: 32.3 ml/min (by C-G formula based on Cr of 1.22). No results found for this basename: VANCOTROUGH, VANCOPEAK, VANCORANDOM, GENTTROUGH, GENTPEAK, GENTRANDOM, TOBRATROUGH, TOBRAPEAK, TOBRARND, AMIKACINPEAK, AMIKACINTROU, AMIKACIN,  in the last 72 hours   Microbiology: No results found for this or any previous visit (from the past 720 hour(s)).  Medical History: Past Medical History  Diagnosis Date  . Diabetes mellitus without complication   . Prostate cancer     Medications:  Prescriptions prior to admission  Medication Sig Dispense Refill  . abiraterone Acetate (ZYTIGA) 250 MG tablet Take 1,000 mg by mouth daily. Take on an empty stomach 1 hour before or 2 hours after a meal      . HYDROcodone-acetaminophen (NORCO/VICODIN) 5-325 MG per tablet Take 1 tablet by mouth 2 (two) times daily as needed for moderate pain.      Marland Kitchen levofloxacin (LEVAQUIN) 750 MG tablet Take 750 mg by mouth daily.      Marland Kitchen LORazepam (ATIVAN) 0.5 MG tablet Take 0.5 mg by mouth at bedtime as needed for anxiety or sleep.      . nateglinide (STARLIX) 60 MG tablet Take 60 mg by mouth 3 (three) times daily with meals.       Assessment: 80 yo F admitted 08/12/2013  With a SBO.  Pharmacy consulted to dose ceftriaxone for a UTI.   Goal of Therapy:  Renal adjustment of antibiotics  Plan:  Ceftriaxone 1g IV q24h, no further renal  adjustment indicated Pharmacy will sign off, please call with questions   Vestal Markin Rickey Primus 08/12/2013,7:03 PM

## 2013-08-12 NOTE — ED Notes (Signed)
Provider at bedside

## 2013-08-12 NOTE — ED Notes (Signed)
Onset one day ago not eating and one episode of emesis dark in color reported to EMS by family and general fatigue. Patient alert answering and following commands appropriate.

## 2013-08-12 NOTE — ED Notes (Signed)
Results of lactic acid given to Dr. Delo 

## 2013-08-12 NOTE — ED Provider Notes (Signed)
Medical screening examination/treatment/procedure(s) were performed by non-physician practitioner and as supervising physician I was immediately available for consultation/collaboration.   Date: 08/12/2013  Rate: 111  Rhythm: sinus tachycardia  QRS Axis: normal  Intervals: normal  ST/T Wave abnormalities: normal  Conduction Disutrbances:none  Narrative Interpretation:   Old EKG Reviewed: unchanged     Veryl Speak, MD 08/12/13 1551

## 2013-08-12 NOTE — Progress Notes (Addendum)
Report received from ED nurse Marya Amsler for patient to be admitted into 5w13

## 2013-08-13 ENCOUNTER — Inpatient Hospital Stay (HOSPITAL_COMMUNITY): Payer: Medicare Other

## 2013-08-13 DIAGNOSIS — D649 Anemia, unspecified: Secondary | ICD-10-CM

## 2013-08-13 DIAGNOSIS — R000 Tachycardia, unspecified: Secondary | ICD-10-CM

## 2013-08-13 DIAGNOSIS — E872 Acidosis, unspecified: Secondary | ICD-10-CM

## 2013-08-13 DIAGNOSIS — K56609 Unspecified intestinal obstruction, unspecified as to partial versus complete obstruction: Principal | ICD-10-CM

## 2013-08-13 DIAGNOSIS — E119 Type 2 diabetes mellitus without complications: Secondary | ICD-10-CM

## 2013-08-13 DIAGNOSIS — C61 Malignant neoplasm of prostate: Secondary | ICD-10-CM

## 2013-08-13 DIAGNOSIS — N390 Urinary tract infection, site not specified: Secondary | ICD-10-CM

## 2013-08-13 DIAGNOSIS — C7952 Secondary malignant neoplasm of bone marrow: Secondary | ICD-10-CM

## 2013-08-13 DIAGNOSIS — C7951 Secondary malignant neoplasm of bone: Secondary | ICD-10-CM

## 2013-08-13 LAB — BASIC METABOLIC PANEL
BUN: 25 mg/dL — AB (ref 6–23)
BUN: 23 mg/dL (ref 6–23)
CHLORIDE: 112 meq/L (ref 96–112)
CO2: 22 mEq/L (ref 19–32)
CO2: 22 meq/L (ref 19–32)
CREATININE: 1.52 mg/dL — AB (ref 0.50–1.35)
CREATININE: 1.05 mg/dL (ref 0.50–1.35)
Calcium: 8.3 mg/dL — ABNORMAL LOW (ref 8.4–10.5)
Calcium: 6.6 mg/dL — ABNORMAL LOW (ref 8.4–10.5)
Chloride: 103 mEq/L (ref 96–112)
GFR calc Af Amer: 69 mL/min — ABNORMAL LOW (ref >90)
GFR calc non Af Amer: 38 mL/min — ABNORMAL LOW (ref >90)
GFR calc non Af Amer: 59 mL/min — ABNORMAL LOW (ref >90)
GFR, EST AFRICAN AMERICAN: 44 mL/min — AB (ref >90)
Glucose, Bld: 121 mg/dL — ABNORMAL HIGH (ref 70–99)
Glucose, Bld: 79 mg/dL (ref 70–99)
POTASSIUM: 4.5 meq/L (ref 3.7–5.3)
POTASSIUM: 2.9 meq/L — AB (ref 3.7–5.3)
Sodium: 145 mEq/L (ref 137–147)
Sodium: 150 mEq/L — ABNORMAL HIGH (ref 137–147)

## 2013-08-13 LAB — CBC
HCT: 36.1 % — ABNORMAL LOW (ref 39.0–52.0)
HEMOGLOBIN: 11.4 g/dL — AB (ref 13.0–17.0)
MCH: 26.1 pg (ref 26.0–34.0)
MCHC: 31.6 g/dL (ref 30.0–36.0)
MCV: 82.6 fL (ref 78.0–100.0)
Platelets: 414 10*3/uL — ABNORMAL HIGH (ref 150–400)
RBC: 4.37 MIL/uL (ref 4.22–5.81)
RDW: 17.2 % — AB (ref 11.5–15.5)
WBC: 5.3 10*3/uL (ref 4.0–10.5)

## 2013-08-13 LAB — GLUCOSE, CAPILLARY
Glucose-Capillary: 115 mg/dL — ABNORMAL HIGH (ref 70–99)
Glucose-Capillary: 93 mg/dL (ref 70–99)

## 2013-08-13 LAB — URINE CULTURE
COLONY COUNT: NO GROWTH
Culture: NO GROWTH

## 2013-08-13 LAB — MAGNESIUM: Magnesium: 1.6 mg/dL (ref 1.5–2.5)

## 2013-08-13 MED ORDER — SODIUM CHLORIDE 0.9 % IV SOLN: INTRAVENOUS | Status: DC

## 2013-08-13 MED ORDER — POTASSIUM CHLORIDE 20 MEQ PO PACK
40.0000 meq | PACK | Freq: Four times a day (QID) | ORAL | Status: DC
  Filled 2013-08-13 (×2): qty 2

## 2013-08-13 MED ORDER — SODIUM CHLORIDE 0.9 % IV SOLN
INTRAVENOUS | Status: DC
  Administered 2013-08-13 – 2013-08-14 (×5): via INTRAVENOUS

## 2013-08-13 MED ORDER — POTASSIUM CHLORIDE 10 MEQ/100ML IV SOLN
10.0000 meq | INTRAVENOUS | Status: AC
  Administered 2013-08-13 – 2013-08-14 (×4): 10 meq via INTRAVENOUS
  Filled 2013-08-13 (×4): qty 100

## 2013-08-13 NOTE — Progress Notes (Signed)
Subjective: Pt feels about the same.  Not as much N/V since NG tube placed (621mL dark bile out).  Abdominal pain the same with palpation.  Thirsty.  Son at bedside.  Objective: Vital signs in last 24 hours: Temp:  [97.5 F (36.4 C)-98.2 F (36.8 C)] 97.6 F (36.4 C) (05/16 0516) Pulse Rate:  [105-117] 107 (05/16 0516) Resp:  [18-26] 18 (05/16 0516) BP: (94-132)/(54-82) 132/82 mmHg (05/16 0516) SpO2:  [95 %-100 %] 96 % (05/16 0516) Weight:  [130 lb (58.968 kg)-130 lb 3.2 oz (59.058 kg)] 130 lb 3.2 oz (59.058 kg) (05/15 1836) Last BM Date: 08/11/13  Intake/Output from previous day: 05/15 0701 - 05/16 0700 In: -  Out: 1500 [Urine:100; Emesis/NG output:600] Intake/Output this shift:    PE: Gen:  Alert, NAD, pleasant Card:  RRR, murmur heard, no G/R Abd: Thin, soft, mild tenderness in the RLQ, ND, +BS, no HSM, b/l inguinal scars well healed Ext:  No erythema, edema, or tenderness  Lab Results:   Recent Labs  08/12/13 1050 08/13/13 0500  WBC 6.0 5.3  HGB 12.2* 11.4*  HCT 38.1* 36.1*  PLT 405* 414*   BMET  Recent Labs  08/12/13 1050 08/13/13 0500  NA 141 145  K 3.6* 4.5  CL 99 103  CO2 22 22  GLUCOSE 140* 121*  BUN 20 25*  CREATININE 1.22 1.52*  CALCIUM 9.2 8.3*   PT/INR  Recent Labs  08/12/13 1050  LABPROT 15.3*  INR 1.24   CMP     Component Value Date/Time   NA 145 08/13/2013 0500   K 4.5 08/13/2013 0500   CL 103 08/13/2013 0500   CO2 22 08/13/2013 0500   GLUCOSE 121* 08/13/2013 0500   BUN 25* 08/13/2013 0500   CREATININE 1.52* 08/13/2013 0500   CALCIUM 8.3* 08/13/2013 0500   PROT 7.9 08/12/2013 1050   ALBUMIN 2.7* 08/12/2013 1050   AST 27 08/12/2013 1050   ALT 6 08/12/2013 1050   ALKPHOS 335* 08/12/2013 1050   BILITOT 0.4 08/12/2013 1050   GFRNONAA 38* 08/13/2013 0500   GFRAA 44* 08/13/2013 0500   Lipase  No results found for this basename: lipase       Studies/Results: Dg Chest 2 View  08/12/2013   CLINICAL DATA:  Emesis and fatigue.   EXAM: CHEST  2 VIEW  COMPARISON:  None.  FINDINGS: Evaluation of the mediastinum is mildly limited by patient rotation. The cardiac silhouette does not appear enlarged. Thoracic aortic calcification is noted. The lungs are hypoinflated with predominantly linear opacities in the lung bases. A few small nodular opacities are present in both lungs. The most prominent rounded opacity is in the right lung base and could represent nipple shadow. There is blunting of the right costophrenic angle, and a small right pleural effusion is not excluded. No pneumothorax is identified.  There is a large air-fluid level under the diaphragm in the left upper quadrant. There is also mild gaseous distension of multiple bowel loops in the upper abdomen, incompletely imaged. Aortic stent graft is partially visualized. No acute osseous abnormality is identified.  IMPRESSION: 1. Hypoinflation with bibasilar lung opacities, most likely reflecting subsegmental atelectasis although developing infection is not excluded. 2. Scattered, nodular lung opacities. Follow-up formal upright PA and lateral radiographs are suggested when the patient's acute illness has improved and he is able to take a larger inspiration, with consideration for subsequent chest CT. 3. Large air-fluid level in the left upper quadrant, favored to reflect distended stomach, with adjacent mildly  distended bowel loops. Dedicated abdominal radiographs are recommended.   Electronically Signed   By: Logan Bores   On: 08/12/2013 11:30   Ct Abdomen Pelvis W Contrast  08/12/2013   CLINICAL DATA:  Dilated bowel loops and vomiting. Fatigue. Rule out small bowel obstruction.  EXAM: CT ABDOMEN AND PELVIS WITH CONTRAST  TECHNIQUE: Multidetector CT imaging of the abdomen and pelvis was performed using the standard protocol following bolus administration of intravenous contrast.  CONTRAST:  29mL OMNIPAQUE IOHEXOL 300 MG/ML  SOLN  COMPARISON:  None.  FINDINGS: Mild subpleural  ground-glass and reticular opacities are present in the lung bases, which may reflect atelectasis although a component of interstitial lung disease is also a consideration. Calcified pleural plaques are partially visualized in the posterior lungs bilaterally. There is no pleural effusion. Three-vessel coronary artery calcifications are present.  Enhancement of the liver is mildly heterogeneous without focal abnormality identified, possibly due to contrast timing. The spleen is also minimally heterogeneous in attenuation, also likely related to contrast timing. The gallbladder, adrenal glands, and right kidney are unremarkable. 2 cm left upper pole renal cyst is noted. Additional subcentimeter left renal hypodensities also likely represent cysts but are too small to fully characterize. The pancreas is diffusely atrophic.  There is marked distention of the stomach with a large air-fluid level present. The duodenum does not cross the midline, however this may be due to displacement by the large abdominal aortic aneurysm. The majority of the small bowel is fluid-filled and dilated, measuring up to 5 cm in diameter. There is a transition to decompressed distal small bowel in the right lower quadrant, although the precise site of transition is not clearly identified. The colon contains a small amount of stool and is largely decompressed. There is a small amount of intraperitoneal free fluid.  No intraperitoneal free air is identified. A Foley catheter is in place. The prostate is diffusely enlarged and heterogeneous with areas of nodular enhancement present. There is been prior endovascular repair of an abdominal aortic aneurysm with an aorto bi-iliac stent graft. The graft is patent. The native aneurysm sac measures 6.6 x 5.9 cm. The native right common iliac artery aneurysm sac measures 4.2 cm in diameter. There has been prior right internal iliac artery embolization. Aneurysmal dilatation of the common femoral arteries  measures up to 1.6 cm on the right and 1.8 cm on the left with diffuse atherosclerotic calcification is. Sclerotic lesions are present diffusely throughout the visualized ribs, spine, and pelvis.  IMPRESSION: 1. Evidence of high-grade small bowel obstruction with transition to decompressed distal small bowel in the right lower quadrant. 2. Small amount of intraperitoneal free fluid. 3. Enlarged, heterogeneous prostate with diffuse sclerotic bone lesions, consistent with metastatic prostate cancer. 4. Partially calcified pleural plaques, suggestive of prior asbestos exposure. These may be the etiology for the nodular opacities seen on recent chest radiographs. 5. 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   Electronically Signed   By: Logan Bores   On: 08/12/2013 13:31    Anti-infectives: Anti-infectives   Start     Dose/Rate Route Frequency Ordered Stop   08/13/13 1400  cefTRIAXone (ROCEPHIN) 1 g in dextrose 5 % 50 mL IVPB     1 g 100 mL/hr over 30 Minutes Intravenous Every 24 hours 08/12/13 1903     08/12/13 1345  cefTRIAXone (ROCEPHIN) 1 g in dextrose 5 % 50 mL IVPB     1 g 100 mL/hr over 30 Minutes Intravenous  Once 08/12/13 1345 08/12/13 1433       Assessment/Plan Small bowel obstruction (?high grade)  Prostate cancer with h/o urinary obstruction  Diabetes  Multiple medical problems to be managed by medicine   Plan:  1. Continue to treat conservatively for now, hopefully this will resolve on its own without surgical intervention  2. NG tube, NPO, bowel rest, IVF, pain control, antiemetics  3. SCD's and heparin 4. Ambulate and IS to aid in recovery  5. Son at bedside and patient are agreeable to the plan and wish to avoid surgery if at all possible     LOS: 1 day    Coralie Keens 08/13/2013, 7:56 AM Pager: [phone]

## 2013-08-13 NOTE — Progress Notes (Signed)
INITIAL NUTRITION ASSESSMENT  DOCUMENTATION CODES Per approved criteria  -Severe malnutrition in the context of chronic illness   Pt meets criteria for severe MALNUTRITION in the context of chronic illness as evidenced by severe depletion of muscle mass and body fat.   INTERVENTION: Diet advancement per MD Recommend Ensure Complete po BID, each supplement provides 350 kcal and 13 grams of protein, once diet order permits RD to continue to follow nutrition care plan   NUTRITION DIAGNOSIS: Malnutrition related to poor PO intake, appetite as evidenced by severe depletion of muscle mass and body fat.   Goal: Pt to meet >/=90% of estimated nutrition needs  Monitor:  Diet advancement, weight trend, labs   Reason for Assessment: Positive Malnutrition Screening Tool Score   80 y.o. male  Admitting Dx: Small bowel obstruction  ASSESSMENT: Brandon Harrell is 80 year old male with a PMH of DM and met prostate CA (on Lupron and Zytiga) who presents with complaint decreased po and episode of dark brown vomiting. Pt has not eaten in about 4 days.   Pt states recent weight loss in the past year of 35 lbs. Pt reports that he used to weigh 165 lbs and that was his usual weight. This is a 21% weight loss in 1 year which is severe for time frame.   Pt's son reports that pt lives at home alone and does not eat well. Pt states that he does not eat breakfast and could not report exactly what he eats during the day. Pt's son states that he snacks a lot. Pt states he drinks chocolate Boost at home. When diet advances, will order Ensure Complete. Encouraged pt to stay hydrated and drink plenty of fluids.   Pt states that he is not weak and gets around pretty easily at home.  NG tube to suction (695m dark bile out). Pt on bowel rest per PA note.   Nutrition Focused Physical Exam:  Subcutaneous Fat:  Orbital Region: severe depletion  Upper Arm Region: wnl Thoracic and Lumbar Region: moderate depletion    Muscle:  Temple Region: severe depletion  Clavicle Bone Region: severe depletion  Clavicle and Acromion Bone Region: severe depletion  Scapular Bone Region: n/a Dorsal Hand: severe depletion  Patellar Region: moderate depletion  Anterior Thigh Region: moderate depletion  Posterior Calf Region: mild depletion   Edema: none  Elevated BUN, Cr CBG: 115  Potassium WNL   Height: Ht Readings from Last 1 Encounters:  08/12/13 5' 7"  (1.702 m)    Weight: Wt Readings from Last 1 Encounters:  08/12/13 130 lb 3.2 oz (59.058 kg)    Ideal Body Weight: 67.2 kg   % Ideal Body Weight: 88%   Wt Readings from Last 10 Encounters:  08/12/13 130 lb 3.2 oz (59.058 kg)    Usual Body Weight: 165 lbs   % Usual Body Weight: 79%   BMI:  Body mass index is 20.39 kg/(m^2)., Normal   Estimated Nutritional Needs: Kcal: 1500 - 1700 Protein: 65 - 75 grams  Fluid: 1.5 - 1.7 L/day   Skin: WDL   Diet Order: NPO  EDUCATION NEEDS: -Education needs addressed   Intake/Output Summary (Last 24 hours) at 08/13/13 1006 Last data filed at 08/13/13 0520  Gross per 24 hour  Intake      0 ml  Output   1500 ml  Net  -1500 ml    Last BM: 5/14    Labs:   Recent Labs Lab 08/12/13 1050 08/13/13 0500  NA 141  145  K 3.6* 4.5  CL 99 103  CO2 22 22  BUN 20 25*  CREATININE 1.22 1.52*  CALCIUM 9.2 8.3*  GLUCOSE 140* 121*    CBG (last 3)   Recent Labs  08/13/13 0806  GLUCAP 115*    Scheduled Meds: . cefTRIAXone (ROCEPHIN)  IV  1 g Intravenous Q24H  . heparin  5,000 Units Subcutaneous 3 times per day  . sodium chloride  3 mL Intravenous Q12H    Continuous Infusions: . sodium chloride 125 mL/hr at 08/13/13 0920    Past Medical History  Diagnosis Date  . Diabetes mellitus without complication   . Prostate cancer     Past Surgical History  Procedure Laterality Date  . Abdominal aortic aneurysm repair    . Sp endovasc repair iliac art    . Inguinal hernia repair Bilateral      Open    Carrolyn Leigh, BS Dietetic Intern Pager: [phone]  I agree with the above information and made appropriate revisions. Inda Coke MS, RD, LDN Inpatient Registered Dietitian Pager: [phone] After-hours pager: [phone]

## 2013-08-13 NOTE — Plan of Care (Signed)
Problem: Phase I Progression Outcomes Goal: Voiding-avoid urinary catheter unless indicated Outcome: Not Progressing Pt uses a chronic foley due to prostate cancer

## 2013-08-13 NOTE — Consult Note (Signed)
Pt seen and examined.  Chart reviewed.  Non operative management for now. NGT NPO  FILMS

## 2013-08-13 NOTE — H&P (Signed)
INTERNAL MEDICINE TEACHING ATTENDING NOTE  Day 1 of stay  Patient name: Brandon Harrell  MRN: [id number] Date of birth: Jan 06, 1920   80 y.o. african [place] male with high grade intestinal obstruction.   I met with him and his son who was by his bedside this morning.   Vitals reviewed. Filed Vitals:   08/12/13 1800 08/12/13 1836 08/12/13 2305 08/13/13 0516  BP: 115/54 108/67 115/70 132/82  Pulse: 109 108 114 107  Temp:  97.5 F (36.4 C) 98.2 F (36.8 C) 97.6 F (36.4 C)  TempSrc:  Axillary Axillary Axillary  Resp: 21 18 20 18   Height:  5' 7"  (1.702 m)    Weight:  130 lb 3.2 oz (59.058 kg)    SpO2: 98% 96% 97% 96%    General: Resting in bed. NG suction continuous. Appears comfortable.  HEENT: PERRL, EOMI, no scleral icterus. Heart: 4/6 pan systolic murmur, no rubs, murmurs or gallops. Lungs: Clear to auscultation bilaterally, no wheezes, rales, or rhonchi. Abdomen: Soft, mild tenderness RLQ, BS sluggish, no distention. Extremities: Warm, no pedal edema. Neuro: Alert and oriented X3, cranial nerves II-XII grossly intact,  strength and sensation to light touch equal in bilateral upper and lower extremities   Labs and imaging reviewed.    Recent Labs Lab 08/12/13 1050 08/13/13 0500  HGB 12.2* 11.4*  HCT 38.1* 36.1*  WBC 6.0 5.3  PLT 405* 414*    Recent Labs Lab 08/12/13 1050 08/13/13 0500  NA 141 145  K 3.6* 4.5  CL 99 103  CO2 22 22  GLUCOSE 140* 121*  BUN 20 25*  CREATININE 1.22 1.52*  CALCIUM 9.2 8.3*      Assessment and Plan   SBO - Agree with conservative management. >600 ml dark fluid out of NGT per IO chart. Patient reports comfort and no more vomiting. Surgery plans to review films today - appreciate recommendations. Discussed the condition and management with the patient and his son, and they both understand the condition and are in agreement of management. Would repeat Lactic Acid  UTI - ceftriaxone to continue. Change to PO when  possible.  Kidney dysfunction - Creatinine elevation - likely poor PO intake - would continue to gently hydrate.   DM2 - Patient NPO - Q6 CBG checks and appropriate sliding scale as needed.   I have seen and evaluated this patient and discussed it with my IM resident team.  Please see the rest of the plan per resident note from today.   Burle Kwan 08/13/2013, 11:38 AM.

## 2013-08-13 NOTE — Progress Notes (Signed)
CRITICAL VALUE ALERT  Critical value received:  K+ 2.9  Date of notification:  08/13/2013  Time of notification:  2154  Critical value read back:yes  Nurse who received alert:  Steffanie Dunn  MD notified (1st page):  MD for teaching service  Time of first page:  2200  MD notified (2nd page):  Time of second page:  Responding MD:  Teaching service  Time MD responded:  2204

## 2013-08-13 NOTE — Progress Notes (Signed)
Check films today. Hopefully resolve with conservative management.

## 2013-08-13 NOTE — Progress Notes (Signed)
Subjective: Brandon Harrell was seen and examined this AM.  He says he is feeling better today.  He has not had a BM since admission (last BM two days ago) but passing gas.    Objective: Vital signs in last 24 hours: Filed Vitals:   08/12/13 1800 08/12/13 1836 08/12/13 2305 08/13/13 0516  BP: 115/54 108/67 115/70 132/82  Pulse: 109 108 114 107  Temp:  97.5 F (36.4 C) 98.2 F (36.8 C) 97.6 F (36.4 C)  TempSrc:  Axillary Axillary Axillary  Resp: 21 18 20 18   Height:  5\' 7"  (1.702 m)    Weight:  59.058 kg (130 lb 3.2 oz)    SpO2: 98% 96% 97% 96%   Weight change:   Intake/Output Summary (Last 24 hours) at 08/13/13 1127 Last data filed at 08/13/13 0520  Gross per 24 hour  Intake      0 ml  Output   1500 ml  Net  -1500 ml  680mL out from NG  Blood pressure 102/55, pulse 117, temperature 97.5 F (36.4 C), temperature source Rectal, resp. rate 20, height 5\' 7"  (1.702 m), weight 130 lb (58.968 kg), SpO2 97.00%.  General: resting in bed in NAD  HEENT: NG tube in place with greenish/brown discharge Cardiac: RRR, no rubs, murmurs or gallops  Pulm: LLL rales (? Sound of suction from NG), moving normal volumes of air  Abd: soft, nondistended, + BS all four quadrants, TTP RLQ, no guarding or rebound Ext: warm and well perfused, no pedal edema, 2+ DPs  Neuro: alert and oriented X3, responding appropriately, muscle strength equal and intact  Lab Results: Basic Metabolic Panel:  Recent Labs Lab 08/12/13 1050 08/13/13 0500  NA 141 145  K 3.6* 4.5  CL 99 103  CO2 22 22  GLUCOSE 140* 121*  BUN 20 25*  CREATININE 1.22 1.52*  CALCIUM 9.2 8.3*   Liver Function Tests:  Recent Labs Lab 08/12/13 1050  AST 27  ALT 6  ALKPHOS 335*  BILITOT 0.4  PROT 7.9  ALBUMIN 2.7*   CBC:  Recent Labs Lab 08/12/13 1050 08/13/13 0500  WBC 6.0 5.3  NEUTROABS 4.5  --   HGB 12.2* 11.4*  HCT 38.1* 36.1*  MCV 82.1 82.6  PLT 405* 414*   CBG:  Recent Labs Lab 08/13/13 0806  GLUCAP  115*    Recent Labs Lab 08/12/13 1050  LABPROT 15.3*  INR 1.24   Urinalysis:  Recent Labs Lab 08/12/13 1125  COLORURINE AMBER*  LABSPEC 1.026  PHURINE 5.0  GLUCOSEU NEGATIVE  HGBUR LARGE*  BILIRUBINUR SMALL*  KETONESUR 15*  PROTEINUR 100*  UROBILINOGEN 1.0  NITRITE NEGATIVE  LEUKOCYTESUR MODERATE*   Studies/Results: Dg Chest 2 View  08/12/2013   CLINICAL DATA:  Emesis and fatigue.  EXAM: CHEST  2 VIEW  COMPARISON:  None.  FINDINGS: Evaluation of the mediastinum is mildly limited by patient rotation. The cardiac silhouette does not appear enlarged. Thoracic aortic calcification is noted. The lungs are hypoinflated with predominantly linear opacities in the lung bases. A few small nodular opacities are present in both lungs. The most prominent rounded opacity is in the right lung base and could represent nipple shadow. There is blunting of the right costophrenic angle, and a small right pleural effusion is not excluded. No pneumothorax is identified.  There is a large air-fluid level under the diaphragm in the left upper quadrant. There is also mild gaseous distension of multiple bowel loops in the upper abdomen, incompletely imaged. Aortic  stent graft is partially visualized. No acute osseous abnormality is identified.  IMPRESSION: 1. Hypoinflation with bibasilar lung opacities, most likely reflecting subsegmental atelectasis although developing infection is not excluded. 2. Scattered, nodular lung opacities. Follow-up formal upright PA and lateral radiographs are suggested when the patient's acute illness has improved and he is able to take a larger inspiration, with consideration for subsequent chest CT. 3. Large air-fluid level in the left upper quadrant, favored to reflect distended stomach, with adjacent mildly distended bowel loops. Dedicated abdominal radiographs are recommended.   Electronically Signed   By: Logan Bores   On: 08/12/2013 11:30   Ct Abdomen Pelvis W  Contrast  08/12/2013   CLINICAL DATA:  Dilated bowel loops and vomiting. Fatigue. Rule out small bowel obstruction.  EXAM: CT ABDOMEN AND PELVIS WITH CONTRAST  TECHNIQUE: Multidetector CT imaging of the abdomen and pelvis was performed using the standard protocol following bolus administration of intravenous contrast.  CONTRAST:  79mL OMNIPAQUE IOHEXOL 300 MG/ML  SOLN  COMPARISON:  None.  FINDINGS: Mild subpleural ground-glass and reticular opacities are present in the lung bases, which may reflect atelectasis although a component of interstitial lung disease is also a consideration. Calcified pleural plaques are partially visualized in the posterior lungs bilaterally. There is no pleural effusion. Three-vessel coronary artery calcifications are present.  Enhancement of the liver is mildly heterogeneous without focal abnormality identified, possibly due to contrast timing. The spleen is also minimally heterogeneous in attenuation, also likely related to contrast timing. The gallbladder, adrenal glands, and right kidney are unremarkable. 2 cm left upper pole renal cyst is noted. Additional subcentimeter left renal hypodensities also likely represent cysts but are too small to fully characterize. The pancreas is diffusely atrophic.  There is marked distention of the stomach with a large air-fluid level present. The duodenum does not cross the midline, however this may be due to displacement by the large abdominal aortic aneurysm. The majority of the small bowel is fluid-filled and dilated, measuring up to 5 cm in diameter. There is a transition to decompressed distal small bowel in the right lower quadrant, although the precise site of transition is not clearly identified. The colon contains a small amount of stool and is largely decompressed. There is a small amount of intraperitoneal free fluid.  No intraperitoneal free air is identified. A Foley catheter is in place. The prostate is diffusely enlarged and  heterogeneous with areas of nodular enhancement present. There is been prior endovascular repair of an abdominal aortic aneurysm with an aorto bi-iliac stent graft. The graft is patent. The native aneurysm sac measures 6.6 x 5.9 cm. The native right common iliac artery aneurysm sac measures 4.2 cm in diameter. There has been prior right internal iliac artery embolization. 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 Sclerotic lesions are present diffusely throughout the visualized ribs, spine, and pelvis.  IMPRESSION: 1. Evidence of high-grade small bowel obstruction with transition to decompressed distal small bowel in the right lower quadrant. 2. Small amount of intraperitoneal free fluid. 3. Enlarged, heterogeneous prostate with diffuse sclerotic bone lesions, consistent with metastatic prostate cancer. 4. Partially calcified pleural plaques, suggestive of prior asbestos exposure. These may be the etiology for the nodular opacities seen on recent chest radiographs. 5. 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   Electronically Signed   By: Logan Bores   On:  08/12/2013 13:31   Medications: I have reviewed the patient's current medications. Scheduled Meds: . cefTRIAXone (ROCEPHIN)  IV  1 g Intravenous Q24H  . heparin  5,000 Units Subcutaneous 3 times per day  . sodium chloride  3 mL Intravenous Q12H   Continuous Infusions: . sodium chloride 125 mL/hr at 08/13/13 0920   PRN Meds:.fentaNYL, ondansetron (ZOFRAN) IV  Assessment/Plan: 80 year old male with a PMH of DM and prostate CA who presents with complaint of decreased po and episode of vomiting x 4 days.   SBO: CT - High-grade small bowel obstruction w/ transition to decompressed distal small bowel in the RLQ. Surgery evaluated patient in the ED and recommendations appreciated.  - continue  conservative management: NG, NPO, IV fluids (bolus, then maintenance), symptom management  - Fentanyl for pain control, Zofran for nausea  - ambulate as tolerated - follow-up abdominal 2 view  - surgery following   Tachycardia: Likely 2/2 to dehydration in the setting of decreased po. Poc troponin negative. CTA originally ordered to r/o PE in the setting of active malignancy.  However CTA never done, patient w/o chest pain or hypoxia so will hold off on CTA for now.   - IV fluids - reconsider CTA if no improvement with hydration - monitor on telemetry  - AM EKG   AG metabolic acidosis: AG 20, likely 2/2 to lactic acidosis 2.36 in the setting of SBO.  - treating SBO as above  - monitor lactic acid, BMP   AKI:  Unknown baseline but Cr 1.22 at admission and 1.52 today.   - continue IV fluids - monitor BMP  UTI: Patient asymptomatic. Received ceftriaxone in the ED. His son reports he has been prescribed antibiotics twice in the past month for UTI. Most recently prescribed Levaquin for UTI and possible PNA (1 week ago).  - continue ceftriaxone  - urine culture pending  Anemia: Hgb 12.2, unknown baseline. + hematemesis, neg FOBT.  - monitor CBC   Prostate Ca with mets to spine: Currently receiving Lupron and Zytiga. Urologist is in [place].  - hold above medications while patient NPO  - Fentanyl for pain control   Diet: NPO  VTE ppx: SCDs, heparin  Code: Full  Dispo: Disposition is deferred at this time, awaiting improvement of current medical problems.  Anticipated discharge in approximately 2-3 day(s).   The patient does not have a current PCP (No Pcp Per Patient) and does not know need an [facility] hospital follow-up appointment after discharge.  The patient does not know have transportation limitations that hinder transportation to clinic appointments.  .Services Needed at time of discharge: Y = Yes, Blank = No PT:   OT:   RN:   Equipment:   Other:     LOS: 1 day   Brandon Maxin, DO 08/13/2013, 11:27 AM

## 2013-08-14 LAB — BASIC METABOLIC PANEL
BUN: 25 mg/dL — AB (ref 6–23)
BUN: 21 mg/dL (ref 6–23)
CALCIUM: 8.3 mg/dL — AB (ref 8.4–10.5)
CHLORIDE: 107 meq/L (ref 96–112)
CO2: 23 mEq/L (ref 19–32)
CO2: 24 mEq/L (ref 19–32)
CREATININE: 1.04 mg/dL (ref 0.50–1.35)
Calcium: 8 mg/dL — ABNORMAL LOW (ref 8.4–10.5)
Chloride: 107 mEq/L (ref 96–112)
Creatinine, Ser: 0.91 mg/dL (ref 0.50–1.35)
GFR calc non Af Amer: 60 mL/min — ABNORMAL LOW (ref >90)
GFR calc non Af Amer: 71 mL/min — ABNORMAL LOW (ref >90)
GFR, EST AFRICAN AMERICAN: 70 mL/min — AB (ref >90)
GFR, EST AFRICAN AMERICAN: 83 mL/min — AB (ref >90)
GLUCOSE: 79 mg/dL (ref 70–99)
GLUCOSE: 68 mg/dL — AB (ref 70–99)
POTASSIUM: 3.6 meq/L — AB (ref 3.7–5.3)
Potassium: 3.5 mEq/L — ABNORMAL LOW (ref 3.7–5.3)
Sodium: 146 mEq/L (ref 137–147)
Sodium: 148 mEq/L — ABNORMAL HIGH (ref 137–147)

## 2013-08-14 LAB — GLUCOSE, CAPILLARY
GLUCOSE-CAPILLARY: 134 mg/dL — AB (ref 70–99)
Glucose-Capillary: 122 mg/dL — ABNORMAL HIGH (ref 70–99)
Glucose-Capillary: 64 mg/dL — ABNORMAL LOW (ref 70–99)
Glucose-Capillary: 77 mg/dL (ref 70–99)
Glucose-Capillary: 84 mg/dL (ref 70–99)
Glucose-Capillary: 92 mg/dL (ref 70–99)

## 2013-08-14 LAB — CBC
HEMATOCRIT: 32.1 % — AB (ref 39.0–52.0)
HEMOGLOBIN: 10.1 g/dL — AB (ref 13.0–17.0)
MCH: 26 pg (ref 26.0–34.0)
MCHC: 31.5 g/dL (ref 30.0–36.0)
MCV: 82.5 fL (ref 78.0–100.0)
Platelets: 362 10*3/uL (ref 150–400)
RBC: 3.89 MIL/uL — ABNORMAL LOW (ref 4.22–5.81)
RDW: 17.3 % — AB (ref 11.5–15.5)
WBC: 6.5 10*3/uL (ref 4.0–10.5)

## 2013-08-14 MED ORDER — DEXTROSE-NACL 5-0.45 % IV SOLN
INTRAVENOUS | Status: DC
  Administered 2013-08-14 – 2013-08-16 (×3): via INTRAVENOUS
  Administered 2013-08-16: 1000 mL via INTRAVENOUS
  Administered 2013-08-17: 02:00:00 via INTRAVENOUS
  Administered 2013-08-17: 125 mL/h via INTRAVENOUS
  Administered 2013-08-18: 1000 mL via INTRAVENOUS
  Administered 2013-08-18: 01:00:00 via INTRAVENOUS

## 2013-08-14 MED ORDER — BISACODYL 10 MG RE SUPP
10.0000 mg | Freq: Once | RECTAL | Status: AC
  Administered 2013-08-14: 10 mg via RECTAL
  Filled 2013-08-14: qty 1

## 2013-08-14 MED ORDER — DEXTROSE 50 % IV SOLN
INTRAVENOUS | Status: AC
  Administered 2013-08-14: 50 mL via INTRAVENOUS
  Filled 2013-08-14: qty 50

## 2013-08-14 MED ORDER — DEXTROSE 50 % IV SOLN
1.0000 | Freq: Once | INTRAVENOUS | Status: AC
  Administered 2013-08-14: 50 mL via INTRAVENOUS

## 2013-08-14 MED ORDER — POTASSIUM CHLORIDE 10 MEQ/100ML IV SOLN
10.0000 meq | INTRAVENOUS | Status: AC
  Administered 2013-08-14 (×3): 10 meq via INTRAVENOUS
  Filled 2013-08-14 (×3): qty 100

## 2013-08-14 MED ORDER — POTASSIUM CHLORIDE 10 MEQ/100ML IV SOLN
10.0000 meq | INTRAVENOUS | Status: AC
  Administered 2013-08-14 – 2013-08-15 (×4): 10 meq via INTRAVENOUS
  Filled 2013-08-14 (×4): qty 100

## 2013-08-14 NOTE — Progress Notes (Signed)
Subjective: NAEON. Pt continues to have significant NG outpt overnight -1800 mL. Pt feels about the same he is accompanied by his  granddaughter. There is question if pt is passing flatus but no BM. Pt been having some ice chips w/o problem. Pt did try to ambulate overnight.   Objective: Vital signs in last 24 hours: Filed Vitals:   08/13/13 0516 08/13/13 1351 08/13/13 2141 08/14/13 0626  BP: 132/82 130/67 138/72 123/61  Pulse: 107 111 105 101  Temp: 97.6 F (36.4 C) 97.5 F (36.4 C) 97.5 F (36.4 C) 98.1 F (36.7 C)  TempSrc: Axillary Axillary Axillary Axillary  Resp: 18 18 18 18   Height:      Weight:      SpO2: 96% 95% 97% 95%   Weight change:   Intake/Output Summary (Last 24 hours) at 08/14/13 0840 Last data filed at 08/14/13 7824  Gross per 24 hour  Intake      0 ml  Output   2750 ml  Net  -2750 ml  1800 mL out from NG  Blood pressure 102/55, pulse 117, temperature 97.5 F (36.4 C), temperature source Rectal, resp. rate 20, height 5\' 7"  (1.702 m), weight 130 lb (58.968 kg), SpO2 97.00%.  General: resting in bed in NAD  HEENT: NG tube in place with greenish/brown discharge Cardiac: RRR, no rubs, murmurs or gallops  Pulm: CTAB, no crackles or wheezes, moving normal volumes of air  Abd: soft, nondistended, + BS all four quadrants, TTP RLQ, no guarding or rebound Ext: warm and well perfused, no pedal edema, 2+ DPs  Neuro: alert and oriented X3, responding appropriately, muscle strength equal and intact  Lab Results: Basic Metabolic Panel:  Recent Labs Lab 08/13/13 2038 08/13/13 2230 08/14/13 0356  NA 150*  --  146  K 2.9*  --  3.5*  CL 112  --  107  CO2 22  --  23  GLUCOSE 79  --  79  BUN 23  --  25*  CREATININE 1.05  --  1.04  CALCIUM 6.6*  --  8.0*  MG  --  1.6  --    Liver Function Tests:  Recent Labs Lab 08/12/13 1050  AST 27  ALT 6  ALKPHOS 335*  BILITOT 0.4  PROT 7.9  ALBUMIN 2.7*   CBC:  Recent Labs Lab 08/12/13 1050 08/13/13 0500  08/14/13 0356  WBC 6.0 5.3 6.5  NEUTROABS 4.5  --   --   HGB 12.2* 11.4* 10.1*  HCT 38.1* 36.1* 32.1*  MCV 82.1 82.6 82.5  PLT 405* 414* 362   CBG:  Recent Labs Lab 08/13/13 0806 08/13/13 1841 08/14/13 0034 08/14/13 0624  GLUCAP 115* 93 92 84    Recent Labs Lab 08/12/13 1050  LABPROT 15.3*  INR 1.24   Urinalysis:  Recent Labs Lab 08/12/13 1125  COLORURINE AMBER*  LABSPEC 1.026  PHURINE 5.0  GLUCOSEU NEGATIVE  HGBUR LARGE*  BILIRUBINUR SMALL*  KETONESUR 15*  PROTEINUR 100*  UROBILINOGEN 1.0  NITRITE NEGATIVE  LEUKOCYTESUR MODERATE*   Studies/Results: X-ray Chest Pa And Lateral   08/13/2013   CLINICAL DATA:  Infiltrates.  EXAM: CHEST  2 VIEW  COMPARISON:  Aug 12, 2013  FINDINGS: The heart size and mediastinal contours are stable. The aorta is tortuous. There is atelectasis of bilateral lung bases not changed compared to prior exam. Previously questioned nodularity of the right mid lung is unchanged. There are small posterior pleural effusions. Nasogastric tube is noted distal tip in the stomach. The  visualized skeletal structures are stable.  IMPRESSION: Atelectasis of bilateral lung bases not significantly changed compared to prior exam. Small bilateral posterior pleural effusions.   Electronically Signed   By: Abelardo Diesel M.D.   On: 08/13/2013 18:51   Dg Chest 2 View  08/12/2013   CLINICAL DATA:  Emesis and fatigue.  EXAM: CHEST  2 VIEW  COMPARISON:  None.  FINDINGS: Evaluation of the mediastinum is mildly limited by patient rotation. The cardiac silhouette does not appear enlarged. Thoracic aortic calcification is noted. The lungs are hypoinflated with predominantly linear opacities in the lung bases. A few small nodular opacities are present in both lungs. The most prominent rounded opacity is in the right lung base and could represent nipple shadow. There is blunting of the right costophrenic angle, and a small right pleural effusion is not excluded. No  pneumothorax is identified.  There is a large air-fluid level under the diaphragm in the left upper quadrant. There is also mild gaseous distension of multiple bowel loops in the upper abdomen, incompletely imaged. Aortic stent graft is partially visualized. No acute osseous abnormality is identified.  IMPRESSION: 1. Hypoinflation with bibasilar lung opacities, most likely reflecting subsegmental atelectasis although developing infection is not excluded. 2. Scattered, nodular lung opacities. Follow-up formal upright PA and lateral radiographs are suggested when the patient's acute illness has improved and he is able to take a larger inspiration, with consideration for subsequent chest CT. 3. Large air-fluid level in the left upper quadrant, favored to reflect distended stomach, with adjacent mildly distended bowel loops. Dedicated abdominal radiographs are recommended.   Electronically Signed   By: Logan Bores   On: 08/12/2013 11:30   Ct Abdomen Pelvis W Contrast  08/12/2013   CLINICAL DATA:  Dilated bowel loops and vomiting. Fatigue. Rule out small bowel obstruction.  EXAM: CT ABDOMEN AND PELVIS WITH CONTRAST  TECHNIQUE: Multidetector CT imaging of the abdomen and pelvis was performed using the standard protocol following bolus administration of intravenous contrast.  CONTRAST:  40mL OMNIPAQUE IOHEXOL 300 MG/ML  SOLN  COMPARISON:  None.  FINDINGS: Mild subpleural ground-glass and reticular opacities are present in the lung bases, which may reflect atelectasis although a component of interstitial lung disease is also a consideration. Calcified pleural plaques are partially visualized in the posterior lungs bilaterally. There is no pleural effusion. Three-vessel coronary artery calcifications are present.  Enhancement of the liver is mildly heterogeneous without focal abnormality identified, possibly due to contrast timing. The spleen is also minimally heterogeneous in attenuation, also likely related to contrast  timing. The gallbladder, adrenal glands, and right kidney are unremarkable. 2 cm left upper pole renal cyst is noted. Additional subcentimeter left renal hypodensities also likely represent cysts but are too small to fully characterize. The pancreas is diffusely atrophic.  There is marked distention of the stomach with a large air-fluid level present. The duodenum does not cross the midline, however this may be due to displacement by the large abdominal aortic aneurysm. The majority of the small bowel is fluid-filled and dilated, measuring up to 5 cm in diameter. There is a transition to decompressed distal small bowel in the right lower quadrant, although the precise site of transition is not clearly identified. The colon contains a small amount of stool and is largely decompressed. There is a small amount of intraperitoneal free fluid.  No intraperitoneal free air is identified. A Foley catheter is in place. The prostate is diffusely enlarged and heterogeneous with areas of nodular enhancement  present. There is been prior endovascular repair of an abdominal aortic aneurysm with an aorto bi-iliac stent graft. The graft is patent. The native aneurysm sac measures 6.6 x 5.9 cm. The native right common iliac artery aneurysm sac measures 4.2 cm in diameter. There has been prior right internal iliac artery embolization. 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 Sclerotic lesions are present diffusely throughout the visualized ribs, spine, and pelvis.  IMPRESSION: 1. Evidence of high-grade small bowel obstruction with transition to decompressed distal small bowel in the right lower quadrant. 2. Small amount of intraperitoneal free fluid. 3. Enlarged, heterogeneous prostate with diffuse sclerotic bone lesions, consistent with metastatic prostate cancer. 4. Partially calcified pleural plaques, suggestive of  prior asbestos exposure. These may be the etiology for the nodular opacities seen on recent chest radiographs. 5. 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   Electronically Signed   By: Logan Bores   On: 08/12/2013 13:31   Dg Abd 2 Views  08/13/2013   CLINICAL DATA:  Small bowel obstruction  EXAM: ABDOMEN - 2 VIEW  COMPARISON:  CT ABD/PELVIS W CM dated 08/12/2013  FINDINGS: Aortic stent graft reidentified. Increase in prominence of numerous dilated small bowel loops is noted, particularly over the right mid abdomen. No free air. Nasogastric tube terminates over the expected location of the stomach. Coils project over the pelvis.  IMPRESSION: Increased dilatation of predominantly right-sided dilated small bowel loops with persistent overall pattern of small bowel obstruction. This does carry a risk of bowel ischemia.   Electronically Signed   By: Conchita Paris M.D.   On: 08/13/2013 19:05   Medications: I have reviewed the patient's current medications. Scheduled Meds: . cefTRIAXone (ROCEPHIN)  IV  1 g Intravenous Q24H  . heparin  5,000 Units Subcutaneous 3 times per day  . sodium chloride  3 mL Intravenous Q12H   Continuous Infusions: . sodium chloride 125 mL/hr at 08/13/13 2257   PRN Meds:.fentaNYL, ondansetron (ZOFRAN) IV  Assessment/Plan: 80 year old male with a PMH of DM and prostate CA who presents with complaint of decreased po and episode of vomiting x 4 days.   SBO: CT - High-grade small bowel obstruction w/ transition to decompressed distal small bowel in the RLQ. Surgery evaluated patient in the ED and recommendations appreciated.  - continue conservative management: NG, NPO, IV fluids (bolus, then maintenance), symptom management  - Fentanyl for pain control, Zofran for nausea  - ambulate as tolerated - follow-up abdominal 2 view  - surgery following   Hypokalemia in setting of large NG outpt: Overnight pt K 2.9 and was repleted repeat was  still 3.5. As pt continues to have large volume output will still need to repleate.  -2 more runs of K -repeat Bmet at 1800 -trend Bmet   Tachycardia 2/2 dehydration: Pt has decreased po and large NG output . Poc troponin negative. CTA originally ordered to r/o PE in the setting of active malignancy.  However CTA never done, patient w/o chest pain or hypoxia so will hold off on CTA for now.   - IV fluids - reconsider CTA if no improvement with hydration - monitor on telemetry  - AM EKG   AG metabolic acidosis resolving : AG 20>>16 this AM, likely 2/2 to lactic acidosis 2.36 in the setting of SBO.  - treating SBO as above  - monitor lactic  acid, BMP   AKI- resolved:  Unknown baseline but Cr 1.22>> 1.52>>1.0 today this in setting of adequate hydration  - continue IV fluids - monitor BMP  UTI: Patient asymptomatic. Received ceftriaxone in the ED. His son reports he has been prescribed antibiotics twice in the past month for UTI. Most recently prescribed Levaquin for UTI and possible PNA (1 week ago).  - continue ceftriaxone  - urine culture pending  Anemia: Hgb 12.2, unknown baseline. + hematemesis, neg FOBT.  - monitor CBC   Prostate Ca with mets to spine: Currently receiving Lupron and Zytiga. Urologist is in [place].  - hold above medications while patient NPO  - Fentanyl for pain control   Diet: NPO  VTE ppx: SCDs, heparin  Code: Full  Dispo: Disposition is deferred at this time, awaiting improvement of current medical problems.  Anticipated discharge in approximately 2-3 day(s).   The patient does not have a current PCP (No Pcp Per Patient) and does not know need an [facility] hospital follow-up appointment after discharge.  The patient does not know have transportation limitations that hinder transportation to clinic appointments.  .Services Needed at time of discharge: Y = Yes, Blank = No PT:   OT:   RN:   Equipment:   Other:     LOS: 2 days   Clinton Gallant,  MD 08/14/2013, 8:40 AM

## 2013-08-14 NOTE — Progress Notes (Addendum)
Pt had one small bowel movement after receiving the suppository at 10:45.  Still producing a large amount of output from NG tube.  Will continue to monitor.

## 2013-08-14 NOTE — Progress Notes (Signed)
Patient's cbg came up to 134 after rechecking. Patient received 25 of D50 and RN hung D5 with 1/2 NS @ 125. Patient is asymptomatic.

## 2013-08-14 NOTE — Progress Notes (Signed)
Subjective: Pt feels about the same.  No more N/V, but abdominal pain still present with palpation.  NPO, NG put out 1813mL/24 hours.  Hasn't been out of bed at all per family.  Maybe some flatus, but no BM.  Objective: Vital signs in last 24 hours: Temp:  [97.5 F (36.4 C)-98.1 F (36.7 C)] 98.1 F (36.7 C) (05/17 0626) Pulse Rate:  [101-111] 101 (05/17 0626) Resp:  [18] 18 (05/17 0626) BP: (123-138)/(61-72) 123/61 mmHg (05/17 0626) SpO2:  [95 %-97 %] 95 % (05/17 0626) Last BM Date: 08/11/13  Intake/Output from previous day: 05/16 0701 - 05/17 0700 In: -  Out: 2750 [Urine:950; Emesis/NG output:1800] Intake/Output this shift:    PE: Gen:  Alert, NAD, pleasant Abd: Soft, mild tenderness in central and lower abdomen,  Few BS, no HSM   Lab Results:   Recent Labs  08/13/13 0500 08/14/13 0356  WBC 5.3 6.5  HGB 11.4* 10.1*  HCT 36.1* 32.1*  PLT 414* 362   BMET  Recent Labs  08/13/13 2038 08/14/13 0356  NA 150* 146  K 2.9* 3.5*  CL 112 107  CO2 22 23  GLUCOSE 79 79  BUN 23 25*  CREATININE 1.05 1.04  CALCIUM 6.6* 8.0*   PT/INR  Recent Labs  08/12/13 1050  LABPROT 15.3*  INR 1.24   CMP     Component Value Date/Time   NA 146 08/14/2013 0356   K 3.5* 08/14/2013 0356   CL 107 08/14/2013 0356   CO2 23 08/14/2013 0356   GLUCOSE 79 08/14/2013 0356   BUN 25* 08/14/2013 0356   CREATININE 1.04 08/14/2013 0356   CALCIUM 8.0* 08/14/2013 0356   PROT 7.9 08/12/2013 1050   ALBUMIN 2.7* 08/12/2013 1050   AST 27 08/12/2013 1050   ALT 6 08/12/2013 1050   ALKPHOS 335* 08/12/2013 1050   BILITOT 0.4 08/12/2013 1050   GFRNONAA 60* 08/14/2013 0356   GFRAA 70* 08/14/2013 0356   Lipase  No results found for this basename: lipase       Studies/Results: X-ray Chest Pa And Lateral   08/13/2013   CLINICAL DATA:  Infiltrates.  EXAM: CHEST  2 VIEW  COMPARISON:  Aug 12, 2013  FINDINGS: The heart size and mediastinal contours are stable. The aorta is tortuous. There is  atelectasis of bilateral lung bases not changed compared to prior exam. Previously questioned nodularity of the right mid lung is unchanged. There are small posterior pleural effusions. Nasogastric tube is noted distal tip in the stomach. The visualized skeletal structures are stable.  IMPRESSION: Atelectasis of bilateral lung bases not significantly changed compared to prior exam. Small bilateral posterior pleural effusions.   Electronically Signed   By: Abelardo Diesel M.D.   On: 08/13/2013 18:51   Dg Chest 2 View  08/12/2013   CLINICAL DATA:  Emesis and fatigue.  EXAM: CHEST  2 VIEW  COMPARISON:  None.  FINDINGS: Evaluation of the mediastinum is mildly limited by patient rotation. The cardiac silhouette does not appear enlarged. Thoracic aortic calcification is noted. The lungs are hypoinflated with predominantly linear opacities in the lung bases. A few small nodular opacities are present in both lungs. The most prominent rounded opacity is in the right lung base and could represent nipple shadow. There is blunting of the right costophrenic angle, and a small right pleural effusion is not excluded. No pneumothorax is identified.  There is a large air-fluid level under the diaphragm in the left upper quadrant. There is also mild gaseous  distension of multiple bowel loops in the upper abdomen, incompletely imaged. Aortic stent graft is partially visualized. No acute osseous abnormality is identified.  IMPRESSION: 1. Hypoinflation with bibasilar lung opacities, most likely reflecting subsegmental atelectasis although developing infection is not excluded. 2. Scattered, nodular lung opacities. Follow-up formal upright PA and lateral radiographs are suggested when the patient's acute illness has improved and he is able to take a larger inspiration, with consideration for subsequent chest CT. 3. Large air-fluid level in the left upper quadrant, favored to reflect distended stomach, with adjacent mildly distended bowel  loops. Dedicated abdominal radiographs are recommended.   Electronically Signed   By: Logan Bores   On: 08/12/2013 11:30   Ct Abdomen Pelvis W Contrast  08/12/2013   CLINICAL DATA:  Dilated bowel loops and vomiting. Fatigue. Rule out small bowel obstruction.  EXAM: CT ABDOMEN AND PELVIS WITH CONTRAST  TECHNIQUE: Multidetector CT imaging of the abdomen and pelvis was performed using the standard protocol following bolus administration of intravenous contrast.  CONTRAST:  90mL OMNIPAQUE IOHEXOL 300 MG/ML  SOLN  COMPARISON:  None.  FINDINGS: Mild subpleural ground-glass and reticular opacities are present in the lung bases, which may reflect atelectasis although a component of interstitial lung disease is also a consideration. Calcified pleural plaques are partially visualized in the posterior lungs bilaterally. There is no pleural effusion. Three-vessel coronary artery calcifications are present.  Enhancement of the liver is mildly heterogeneous without focal abnormality identified, possibly due to contrast timing. The spleen is also minimally heterogeneous in attenuation, also likely related to contrast timing. The gallbladder, adrenal glands, and right kidney are unremarkable. 2 cm left upper pole renal cyst is noted. Additional subcentimeter left renal hypodensities also likely represent cysts but are too small to fully characterize. The pancreas is diffusely atrophic.  There is marked distention of the stomach with a large air-fluid level present. The duodenum does not cross the midline, however this may be due to displacement by the large abdominal aortic aneurysm. The majority of the small bowel is fluid-filled and dilated, measuring up to 5 cm in diameter. There is a transition to decompressed distal small bowel in the right lower quadrant, although the precise site of transition is not clearly identified. The colon contains a small amount of stool and is largely decompressed. There is a small amount of  intraperitoneal free fluid.  No intraperitoneal free air is identified. A Foley catheter is in place. The prostate is diffusely enlarged and heterogeneous with areas of nodular enhancement present. There is been prior endovascular repair of an abdominal aortic aneurysm with an aorto bi-iliac stent graft. The graft is patent. The native aneurysm sac measures 6.6 x 5.9 cm. The native right common iliac artery aneurysm sac measures 4.2 cm in diameter. There has been prior right internal iliac artery embolization. 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 Sclerotic lesions are present diffusely throughout the visualized ribs, spine, and pelvis.  IMPRESSION: 1. Evidence of high-grade small bowel obstruction with transition to decompressed distal small bowel in the right lower quadrant. 2. Small amount of intraperitoneal free fluid. 3. Enlarged, heterogeneous prostate with diffuse sclerotic bone lesions, consistent with metastatic prostate cancer. 4. Partially calcified pleural plaques, suggestive of prior asbestos exposure. These may be the etiology for the nodular opacities seen on recent chest radiographs. 5. 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  Electronically Signed   By: Logan Bores   On: 08/12/2013 13:31   Dg Abd 2 Views  08/13/2013   CLINICAL DATA:  Small bowel obstruction  EXAM: ABDOMEN - 2 VIEW  COMPARISON:  CT ABD/PELVIS W CM dated 08/12/2013  FINDINGS: Aortic stent graft reidentified. Increase in prominence of numerous dilated small bowel loops is noted, particularly over the right mid abdomen. No free air. Nasogastric tube terminates over the expected location of the stomach. Coils project over the pelvis.  IMPRESSION: Increased dilatation of predominantly right-sided dilated small bowel loops with persistent overall pattern of small bowel  obstruction. This does carry a risk of bowel ischemia.   Electronically Signed   By: Conchita Paris M.D.   On: 08/13/2013 19:05    Anti-infectives: Anti-infectives   Start     Dose/Rate Route Frequency Ordered Stop   08/13/13 1400  cefTRIAXone (ROCEPHIN) 1 g in dextrose 5 % 50 mL IVPB     1 g 100 mL/hr over 30 Minutes Intravenous Every 24 hours 08/12/13 1903     08/12/13 1345  cefTRIAXone (ROCEPHIN) 1 g in dextrose 5 % 50 mL IVPB     1 g 100 mL/hr over 30 Minutes Intravenous  Once 08/12/13 1345 08/12/13 1433       Assessment/Plan Small bowel obstruction (?high grade)  Prostate cancer with h/o urinary obstruction  Diabetes  Multiple medical problems to be managed by medicine   Plan:  1. Continue to treat conservatively for now, hopefully this will resolve on its own without surgical intervention  2. NG tube, NPO, bowel rest, IVF, pain control, antiemetics  3. SCD's and heparin  4. Ambulate and IS to aid in recovery  5. Granddaughter at bedside and patient are agreeable to the plan and wish to avoid surgery if at all possible.  He is not sure if he would consent to surgery if offered.  He is understandably against it given his age.  May wait another 24-48 hours to see if he declares himself.   6.  PT already ordered.  He needs to get up and move more if we have any chance to treat this conservatively. 7.  Try dulcolax    LOS: 2 days    Coralie Keens 08/14/2013, 9:10 AM Pager: [phone]

## 2013-08-14 NOTE — Progress Notes (Signed)
About the same.  May need laparotomy since he is not progressing.  Trying to have BM now so hopefully this will get better.

## 2013-08-14 NOTE — Progress Notes (Signed)
Hypoglycemic Event  CBG: 64  Treatment: D50 IV 50 mL  Symptoms: Fatigue  Possible Reasons for Event: Inadequate meal intake  Comments/MD notified: On-call teaching service    Kandee Keen  Remember to initiate Hypoglycemia Order Set & complete

## 2013-08-14 NOTE — Progress Notes (Signed)
Physical Therapy Evaluation Patient Details Name: Brandon Harrell MRN: [id number] DOB: July 10, 1919 Today's Date: 08/14/2013   History of Present Illness  Patient is a 80 yo male admitted 08/12/13 with N/V.  Patient with SBO, being treated conservatively at this time.  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  Clinical Impression  Patient presents with problems listed below.  Will benefit from acute PT to maximize independence prior to discharge home with family.    Follow Up Recommendations Home health PT;Supervision/Assistance - 24 hour    Equipment Recommendations  None recommended by PT    Recommendations for Other Services       Precautions / Restrictions Precautions Precautions: Fall Precaution Comments: NG suction tube Restrictions Weight Bearing Restrictions: No      Mobility  Bed Mobility Overal bed mobility: Needs Assistance Bed Mobility: Supine to Sit;Sit to Supine     Supine to sit: Min assist Sit to supine: Min assist   General bed mobility comments: Verbal cues for technique.  Assist to raise trunk to sitting position.  Transfers Overall transfer level: Needs assistance Equipment used: Rolling walker (2 wheeled) Transfers: Sit to/from Stand Sit to Stand: Min assist         General transfer comment: Verbal cues for hand placement.  Assist to rise to standing and for balance initially.  Ambulation/Gait Ambulation/Gait assistance: Min guard Ambulation Distance (Feet): 82 Feet Assistive device: Rolling walker (2 wheeled) Gait Pattern/deviations: Step-through pattern;Decreased stride length;Trunk flexed Gait velocity: Decreased Gait velocity interpretation: Below normal speed for age/gender General Gait Details: Verbal cues for safe use of RW and to stand upright during gait.  Patient fatigued quickly, requiring 2 standing rest breaks during gait.  Stairs            Wheelchair Mobility    Modified Rankin (Stroke Patients Only)        Balance                                             Pertinent Vitals/Pain     Home Living Family/patient expects to be discharged to:: Private residence Living Arrangements: Children (Recently moved in with son and granddaughters) Available Help at Discharge: Family;Available 24 hours/day Type of Home: House Home Access: Level entry     Home Layout: One level Home Equipment: Cane - single point;Walker - 2 wheels;Wheelchair - manual;Shower seat      Prior Function Level of Independence: Independent with assistive device(s);Needs assistance (Uses cane at times)   Gait / Transfers Assistance Needed: Uses cane at times  ADL's / Homemaking Assistance Needed: Granddaughter assists with meals.  Independent with bathing and dressing  Comments: Patient was living alone (independent) until several weeks ago when he moved in with one of his sons.     Hand Dominance        Extremity/Trunk Assessment   Upper Extremity Assessment: Overall WFL for tasks assessed           Lower Extremity Assessment: Generalized weakness         Communication   Communication: HOH  Cognition Arousal/Alertness: Awake/alert Behavior During Therapy: WFL for tasks assessed/performed Overall Cognitive Status: History of cognitive impairments - at baseline       Memory: Decreased short-term memory              General Comments      Exercises  Assessment/Plan    PT Assessment Patient needs continued PT services  PT Diagnosis Difficulty walking;Generalized weakness   PT Problem List Decreased strength;Decreased activity tolerance;Decreased balance;Decreased mobility;Decreased knowledge of use of DME;Cardiopulmonary status limiting activity  PT Treatment Interventions DME instruction;Gait training;Functional mobility training;Therapeutic exercise;Patient/family education   PT Goals (Current goals can be found in the Care Plan section) Acute Rehab PT  Goals Patient Stated Goal: To get stronger PT Goal Formulation: With patient/family Time For Goal Achievement: 08/21/13 Potential to Achieve Goals: Good    Frequency Min 3X/week   Barriers to discharge        Co-evaluation               End of Session Equipment Utilized During Treatment: Gait belt Activity Tolerance: Patient limited by fatigue Patient left: in bed;with call bell/phone within reach;with family/visitor present Nurse Communication: Mobility status         Time: 1430-1455 PT Time Calculation (min): 25 min   Charges:   PT Evaluation $Initial PT Evaluation Tier I: 1 Procedure PT Treatments $Gait Training: 8-22 mins   PT G Codes:          Despina Pole 08/29/13, 4:48 PM Carita Pian. Sanjuana Kava, [place] Pager [phone]

## 2013-08-15 ENCOUNTER — Inpatient Hospital Stay (HOSPITAL_COMMUNITY): Payer: Medicare Other

## 2013-08-15 DIAGNOSIS — E876 Hypokalemia: Secondary | ICD-10-CM

## 2013-08-15 DIAGNOSIS — E860 Dehydration: Secondary | ICD-10-CM

## 2013-08-15 LAB — BASIC METABOLIC PANEL
BUN: 16 mg/dL (ref 6–23)
BUN: 14 mg/dL (ref 6–23)
BUN: 12 mg/dL (ref 6–23)
CALCIUM: 8.2 mg/dL — AB (ref 8.4–10.5)
CHLORIDE: 107 meq/L (ref 96–112)
CO2: 26 mEq/L (ref 19–32)
CO2: 27 meq/L (ref 19–32)
CO2: 27 mEq/L (ref 19–32)
CREATININE: 0.88 mg/dL (ref 0.50–1.35)
Calcium: 8.2 mg/dL — ABNORMAL LOW (ref 8.4–10.5)
Calcium: 8.1 mg/dL — ABNORMAL LOW (ref 8.4–10.5)
Chloride: 106 mEq/L (ref 96–112)
Chloride: 105 mEq/L (ref 96–112)
Creatinine, Ser: 0.87 mg/dL (ref 0.50–1.35)
Creatinine, Ser: 0.86 mg/dL (ref 0.50–1.35)
GFR calc Af Amer: 84 mL/min — ABNORMAL LOW (ref >90)
GFR calc Af Amer: 85 mL/min — ABNORMAL LOW (ref >90)
GFR calc non Af Amer: 72 mL/min — ABNORMAL LOW (ref >90)
GFR calc non Af Amer: 73 mL/min — ABNORMAL LOW (ref >90)
GFR, EST AFRICAN AMERICAN: 84 mL/min — AB (ref >90)
GFR, EST NON AFRICAN AMERICAN: 73 mL/min — AB (ref >90)
GLUCOSE: 171 mg/dL — AB (ref 70–99)
Glucose, Bld: 141 mg/dL — ABNORMAL HIGH (ref 70–99)
Glucose, Bld: 140 mg/dL — ABNORMAL HIGH (ref 70–99)
POTASSIUM: 3.4 meq/L — AB (ref 3.7–5.3)
Potassium: 3.4 mEq/L — ABNORMAL LOW (ref 3.7–5.3)
Potassium: 3.7 mEq/L (ref 3.7–5.3)
SODIUM: 144 meq/L (ref 137–147)
Sodium: 143 mEq/L (ref 137–147)
Sodium: 144 mEq/L (ref 137–147)

## 2013-08-15 LAB — CBC
HCT: 30 % — ABNORMAL LOW (ref 39.0–52.0)
Hemoglobin: 9.5 g/dL — ABNORMAL LOW (ref 13.0–17.0)
MCH: 26.2 pg (ref 26.0–34.0)
MCHC: 31.7 g/dL (ref 30.0–36.0)
MCV: 82.6 fL (ref 78.0–100.0)
PLATELETS: 340 10*3/uL (ref 150–400)
RBC: 3.63 MIL/uL — ABNORMAL LOW (ref 4.22–5.81)
RDW: 17.3 % — AB (ref 11.5–15.5)
WBC: 7 10*3/uL (ref 4.0–10.5)

## 2013-08-15 LAB — GLUCOSE, CAPILLARY
Glucose-Capillary: 135 mg/dL — ABNORMAL HIGH (ref 70–99)
Glucose-Capillary: 153 mg/dL — ABNORMAL HIGH (ref 70–99)
Glucose-Capillary: 129 mg/dL — ABNORMAL HIGH (ref 70–99)

## 2013-08-15 LAB — MAGNESIUM
MAGNESIUM: 1.8 mg/dL (ref 1.5–2.5)
Magnesium: 1.7 mg/dL (ref 1.5–2.5)

## 2013-08-15 MED ORDER — FLEET ENEMA 7-19 GM/118ML RE ENEM
1.0000 | ENEMA | Freq: Once | RECTAL | Status: AC
  Administered 2013-08-15: 12:00:00 via RECTAL
  Filled 2013-08-15: qty 1

## 2013-08-15 MED ORDER — POTASSIUM CHLORIDE 10 MEQ/100ML IV SOLN
10.0000 meq | INTRAVENOUS | Status: AC
  Administered 2013-08-15 (×4): 10 meq via INTRAVENOUS
  Filled 2013-08-15 (×4): qty 100

## 2013-08-15 NOTE — Care Management Note (Signed)
    Page 1 of 1   08/19/2013     4:35:05 PM CARE MANAGEMENT NOTE 08/19/2013  Patient:  Brandon Harrell, Brandon Harrell   Account Number:  [id number]  Date Initiated:  08/15/2013  Documentation initiated by:  Tomi Bamberger  Subjective/Objective Assessment:   dx sbo  admit- lives with son     Action/Plan:   pt recs hhpt   Anticipated DC Date:  08/19/2013   Anticipated DC Plan:  [place]  CM consult      [facility] Choice  HOME HEALTH   Choice offered to / List presented to:  C-2 HC POA / Guardian        HH arranged  HH-1 RN  [place].   Status of service:  Completed, signed off Medicare Important Message given?   (If response is "NO", the following Medicare IM given date fields will be blank) Date Medicare IM given:   Date Additional Medicare IM given:  08/18/2013  Discharge Disposition:  [place]  Per UR Regulation:  Reviewed for med. necessity/level of care/duration of stay  If discussed at [employer] of Stay Meetings, dates discussed:    Comments:  08/15/13 [place] BSN [phone] patient lives with son, physical therapy rec hhp, will need rn and aid per son.  Additional IM given.  Son chose [facility] from agency list, referrral made to [facility] for [facility], pt and aide.  Soc will begin 24-48 hrs post discharge.

## 2013-08-15 NOTE — Progress Notes (Signed)
Utilization review completed.  

## 2013-08-15 NOTE — Progress Notes (Signed)
Less distended Films improved. NG was pulled out.  Will leave NG out for now.  Repeat films in AM.  If no improvement or if films look worse, will proceed with surgery.  Discussed with family - patient asleep  [name]. Georgette Dover, MD, [facility] Surgery  General/ Trauma Surgery  08/15/2013 2:52 PM

## 2013-08-15 NOTE — Progress Notes (Signed)
Subjective: Pt doing minimally better today, pain slightly less.  No N/V.  NG tube is not functioning well, has a broken filter on it.  Nursing trying to get it functioning again.  Small BM yesterday after dulcolax.  Xray showed mild improvement.  Objective: Vital signs in last 24 hours: Temp:  [97.8 F (36.6 C)-98.5 F (36.9 C)] 98.5 F (36.9 C) (05/18 0604) Pulse Rate:  [96-102] 96 (05/18 0604) Resp:  [18] 18 (05/18 0604) BP: (127-156)/(64-79) 127/64 mmHg (05/18 0604) SpO2:  [93 %-98 %] 95 % (05/18 0604) Last BM Date: 08/11/13  Intake/Output from previous day: 05/17 0701 - 05/18 0700 In: 1300 [I.V.:1250; IV Piggyback:50] Out: 1500 [Urine:300; Emesis/NG output:1200] Intake/Output this shift:    PE: Gen:  Alert, NAD, pleasant Abd: Soft, mild tenderness in central and lower abdomen, +BS, no HSM    Lab Results:   Recent Labs  08/14/13 0356 08/15/13 0611  WBC 6.5 7.0  HGB 10.1* 9.5*  HCT 32.1* 30.0*  PLT 362 340   BMET  Recent Labs  08/14/13 1814 08/15/13 0611  NA 148* 144  K 3.6* 3.4*  CL 107 106  CO2 24 26  GLUCOSE 68* 141*  BUN 21 16  CREATININE 0.91 0.87  CALCIUM 8.3* 8.2*   PT/INR  Recent Labs  08/12/13 1050  LABPROT 15.3*  INR 1.24   CMP     Component Value Date/Time   NA 144 08/15/2013 0611   K 3.4* 08/15/2013 0611   CL 106 08/15/2013 0611   CO2 26 08/15/2013 0611   GLUCOSE 141* 08/15/2013 0611   BUN 16 08/15/2013 0611   CREATININE 0.87 08/15/2013 0611   CALCIUM 8.2* 08/15/2013 0611   PROT 7.9 08/12/2013 1050   ALBUMIN 2.7* 08/12/2013 1050   AST 27 08/12/2013 1050   ALT 6 08/12/2013 1050   ALKPHOS 335* 08/12/2013 1050   BILITOT 0.4 08/12/2013 1050   GFRNONAA 73* 08/15/2013 0611   GFRAA 84* 08/15/2013 0611   Lipase  No results found for this basename: lipase       Studies/Results: X-ray Chest Pa And Lateral   08/13/2013   CLINICAL DATA:  Infiltrates.  EXAM: CHEST  2 VIEW  COMPARISON:  Aug 12, 2013  FINDINGS: The heart size and  mediastinal contours are stable. The aorta is tortuous. There is atelectasis of bilateral lung bases not changed compared to prior exam. Previously questioned nodularity of the right mid lung is unchanged. There are small posterior pleural effusions. Nasogastric tube is noted distal tip in the stomach. The visualized skeletal structures are stable.  IMPRESSION: Atelectasis of bilateral lung bases not significantly changed compared to prior exam. Small bilateral posterior pleural effusions.   Electronically Signed   By: Abelardo Diesel M.D.   On: 08/13/2013 18:51   Dg Abd 2 Views  08/13/2013   CLINICAL DATA:  Small bowel obstruction  EXAM: ABDOMEN - 2 VIEW  COMPARISON:  CT ABD/PELVIS W CM dated 08/12/2013  FINDINGS: Aortic stent graft reidentified. Increase in prominence of numerous dilated small bowel loops is noted, particularly over the right mid abdomen. No free air. Nasogastric tube terminates over the expected location of the stomach. Coils project over the pelvis.  IMPRESSION: Increased dilatation of predominantly right-sided dilated small bowel loops with persistent overall pattern of small bowel obstruction. This does carry a risk of bowel ischemia.   Electronically Signed   By: Conchita Paris M.D.   On: 08/13/2013 19:05    Anti-infectives: Anti-infectives   Start  Dose/Rate Route Frequency Ordered Stop   08/13/13 1400  cefTRIAXone (ROCEPHIN) 1 g in dextrose 5 % 50 mL IVPB     1 g 100 mL/hr over 30 Minutes Intravenous Every 24 hours 08/12/13 1903     08/12/13 1345  cefTRIAXone (ROCEPHIN) 1 g in dextrose 5 % 50 mL IVPB     1 g 100 mL/hr over 30 Minutes Intravenous  Once 08/12/13 1345 08/12/13 1433       Assessment/Plan Small bowel obstruction (?high grade)  Prostate cancer with h/o urinary obstruction  Diabetes  Multiple medical problems to be managed by medicine   Plan:  1. Continue to treat conservatively for now, hopefully this will resolve on its own without surgical  intervention  2. NG tube, NPO, bowel rest, IVF, pain control, antiemetics  3. SCD's and heparin  4. Ambulate and IS to aid in recovery  5. We may want to proceed with surgery, but patient still undecided given mild improvement today.   6. PT following. He needs to get up and move more if we have any chance to treat this conservatively.  7. Dulcolax gave him a small BM.  Try enema.     LOS: 3 days    Coralie Keens 08/15/2013, 8:26 AM Pager: [phone]

## 2013-08-15 NOTE — Progress Notes (Signed)
Physical Therapy Treatment Patient Details Name: Brandon Harrell MRN: [id number] DOB: 11/27/1919 Today's Date: 08/15/2013    History of Present Illness Patient is a 80 yo male admitted 08/12/13 with N/V.  Patient with SBO, being treated conservatively at this time.  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    PT Comments    Pt ambulated 26' with RW and min A (did not go out of room because pt just received enema) and performed exercises standing and sitting. Pt continues with poor activity tolerance and declining level of activity evident in quad weakness and flexed posture. PT will continue to follow.   Follow Up Recommendations  Home health PT;Supervision/Assistance - 24 hour     Equipment Recommendations  None recommended by PT    Recommendations for Other Services       Precautions / Restrictions Precautions Precautions: Fall Precaution Comments: just pulled NG tube out, not restarted yet Restrictions Weight Bearing Restrictions: No    Mobility  Bed Mobility Overal bed mobility: Needs Assistance Bed Mobility: Supine to Sit;Sit to Supine     Supine to sit: Min assist Sit to supine: Min assist   General bed mobility comments: pt needed min A to get legs out of bed but after treatment was able to get legs back into bed without assist. Min A to get trunk positioned comfortably in bed after treatment  Transfers Overall transfer level: Needs assistance Equipment used: Rolling walker (2 wheeled) Transfers: Sit to/from Stand Sit to Stand: Min assist         General transfer comment: pt did not need assist for powering up today but required min A to steady  Ambulation/Gait Ambulation/Gait assistance: Min assist Ambulation Distance (Feet): 46 Feet Assistive device: Rolling walker (2 wheeled) Gait Pattern/deviations: Decreased stride length;Shuffle;Trunk flexed Gait velocity: Decreased   General Gait Details: bilateral knees flexed throughout gait, vc's for  posture which improved trunk flexion partially but pt with some fixed kyphosis. Fatigue evident by end of 43' and became fatigued with multiple turns performed   Stairs            Wheelchair Mobility    Modified Rankin (Stroke Patients Only)       Balance Overall balance assessment: Needs assistance Sitting-balance support: No upper extremity supported;Feet supported Sitting balance-Leahy Scale: Good     Standing balance support: Bilateral upper extremity supported;During functional activity Standing balance-Leahy Scale: Poor Standing balance comment: requires UE support in standing                    Cognition Arousal/Alertness: Awake/alert Behavior During Therapy: WFL for tasks assessed/performed Overall Cognitive Status: History of cognitive impairments - at baseline       Memory: Decreased short-term memory              Exercises General Exercises - Lower Extremity Quad Sets: AROM;Both;10 reps;Seated Gluteal Sets: AROM;5 reps;Seated Long Arc Quad: AROM;Both;10 reps;Seated;Limitations (with manual resistance) Long Arc Quad Limitations: pt with full passive knee ext but lacks full range against gravity due to weakness Heel Slides: AROM;Both;10 reps;Seated;Other (comment) (with manual resistance) Hip Flexion/Marching: AROM;Both;20 reps;Standing Heel Raises: AROM;Both;10 reps;Standing    General Comments        Pertinent Vitals/Pain VSS    Home Living                      Prior Function            PT Goals (current  goals can now be found in the care plan section) Acute Rehab PT Goals Patient Stated Goal: To get stronger PT Goal Formulation: With patient/family Time For Goal Achievement: 08/21/13 Potential to Achieve Goals: Good Progress towards PT goals: Progressing toward goals    Frequency  Min 3X/week    PT Plan Current plan remains appropriate    Co-evaluation             End of Session Equipment Utilized  During Treatment: Gait belt Activity Tolerance: Patient limited by fatigue Patient left: in bed;with call bell/phone within reach;with family/visitor present     Time: 3875-6433 PT Time Calculation (min): 23 min  Charges:  $Gait Training: 8-22 mins $Therapeutic Exercise: 8-22 mins                    G Codes:     [place]  [place] 08/15/2013, 1:32 PM

## 2013-08-15 NOTE — Progress Notes (Signed)
RN notified MD of increased b/p from patient's baseline.

## 2013-08-15 NOTE — Progress Notes (Signed)
Patient ID: Brandon Harrell, male   DOB: 07-04-1919, 80 y.o.   MRN: [id number] Patient examined.  NG tube out. Passing gas. Abdomen soft, non-tender bowel sounds normal to decreased. Impression:  Small bowel obstruction. Responding to conservative Rx. I concur with management plan as outline in resident physician's note.  We appreciate close surgery follow up. Murriel Hopper, MD, [place]  Hematology-Oncology/Internal Medicine

## 2013-08-15 NOTE — Progress Notes (Signed)
Subjective: No acute events overnight.  Patient seen and examined this AM.  He feels better.  No N/V, +flatus and had BM yesterday after Dulcolax, abdominal pain improved.    Objective: Vital signs in last 24 hours: Filed Vitals:   08/14/13 1344 08/14/13 1900 08/14/13 2348 08/15/13 0604  BP: 132/79 156/73 154/76 127/64  Pulse: 98 102  96  Temp: 97.8 F (36.6 C) 97.9 F (36.6 C)  98.5 F (36.9 C)  TempSrc: Oral Oral  Oral  Resp: 18 18  18   Height:      Weight:      SpO2: 98% 93%  95%   Weight change:   Intake/Output Summary (Last 24 hours) at 08/15/13 0936 Last data filed at 08/14/13 2119  Gross per 24 hour  Intake   1300 ml  Output   1500 ml  Net   -200 ml  1200 mL out from NG on 05/17; 4355mL out from NG since admission  Blood pressure 102/55, pulse 117, temperature 97.5 F (36.4 C), temperature source Rectal, resp. rate 20, height 5\' 7"  (1.702 m), weight 130 lb (58.968 kg), SpO2 97.00%.  General: sitting up in chair in NAD  HEENT: NG tube in place with dark drainage Cardiac: RRR, no rubs, murmurs or gallops  Pulm: CTAB, no crackles or wheezes, moving normal volumes of air  Abd: soft, nondistended, + BS all four quadrants, mild TTP RLQ, no guarding or rebound Ext: warm and well perfused, no pedal edema Neuro: alert and oriented X3, responding appropriately, muscle strength equal and intact  Lab Results: Basic Metabolic Panel:  Recent Labs Lab 08/13/13 2038 08/13/13 2230  08/14/13 1814 08/15/13 0611  NA 150*  --   < > 148* 144  K 2.9*  --   < > 3.6* 3.4*  CL 112  --   < > 107 106  CO2 22  --   < > 24 26  GLUCOSE 79  --   < > 68* 141*  BUN 23  --   < > 21 16  CREATININE 1.05  --   < > 0.91 0.87  CALCIUM 6.6*  --   < > 8.3* 8.2*  MG  --  1.6  --   --   --   < > = values in this interval not displayed.  CBC:  Recent Labs Lab 08/12/13 1050  08/14/13 0356 08/15/13 0611  WBC 6.0  < > 6.5 7.0  NEUTROABS 4.5  --   --   --   HGB 12.2*  < > 10.1* 9.5*  HCT  38.1*  < > 32.1* 30.0*  MCV 82.1  < > 82.5 82.6  PLT 405*  < > 362 340  < > = values in this interval not displayed. CBG:  Recent Labs Lab 08/14/13 0624 08/14/13 1146 08/14/13 1845 08/14/13 2044 08/14/13 2355 08/15/13 0602  GLUCAP 84 77 64* 134* 122* 135*   Studies/Results: X-ray Chest Pa And Lateral   08/13/2013   CLINICAL DATA:  Infiltrates.  EXAM: CHEST  2 VIEW  COMPARISON:  Aug 12, 2013  FINDINGS: The heart size and mediastinal contours are stable. The aorta is tortuous. There is atelectasis of bilateral lung bases not changed compared to prior exam. Previously questioned nodularity of the right mid lung is unchanged. There are small posterior pleural effusions. Nasogastric tube is noted distal tip in the stomach. The visualized skeletal structures are stable.  IMPRESSION: Atelectasis of bilateral lung bases not significantly changed compared to prior exam.  Small bilateral posterior pleural effusions.   Electronically Signed   By: Abelardo Diesel M.D.   On: 08/13/2013 18:51   Dg Abd 2 Views  08/15/2013   CLINICAL DATA:  Small bowel obstruction.  EXAM: ABDOMEN - 2 VIEW  COMPARISON:  Abdominal series 5/16 /2015.  CT 08/12/2013.  FINDINGS: Previously identified small bowel distention again noted, this has improved however. Stool is noted within the colon. No free air is noted. Aortoiliac stent graft noted. Coils node in the right lower pelvis. Bony structures about the abdomen and pelvis are diffusely sclerotic consistent blastic metastatic disease.  IMPRESSION: Interim partial improvement in small bowel distention.   Electronically Signed   By: Marcello Moores  Register   On: 08/15/2013 09:07   Dg Abd 2 Views  08/13/2013   CLINICAL DATA:  Small bowel obstruction  EXAM: ABDOMEN - 2 VIEW  COMPARISON:  CT ABD/PELVIS W CM dated 08/12/2013  FINDINGS: Aortic stent graft reidentified. Increase in prominence of numerous dilated small bowel loops is noted, particularly over the right mid abdomen. No free air.  Nasogastric tube terminates over the expected location of the stomach. Coils project over the pelvis.  IMPRESSION: Increased dilatation of predominantly right-sided dilated small bowel loops with persistent overall pattern of small bowel obstruction. This does carry a risk of bowel ischemia.   Electronically Signed   By: Conchita Paris M.D.   On: 08/13/2013 19:05   Medications: I have reviewed the patient's current medications. Scheduled Meds: . cefTRIAXone (ROCEPHIN)  IV  1 g Intravenous Q24H  . heparin  5,000 Units Subcutaneous 3 times per day  . sodium chloride  3 mL Intravenous Q12H  . sodium phosphate  1 enema Rectal Once   Continuous Infusions: . dextrose 5 % and 0.45% NaCl 125 mL/hr at 08/15/13 0211   PRN Meds:.fentaNYL, ondansetron (ZOFRAN) IV  Assessment/Plan: 80 year old male with a PMH of DM and prostate CA who presents with complaint of decreased po and episode of vomiting x 4 days.   SBO: CT - High-grade small bowel obstruction w/ transition to decompressed distal small bowel in the RLQ. Surgery evaluated patient in the ED and recommendations appreciated.  Some improvement on abdominal 2view this AM.   - continue conservative management: NG, NPO, IV fluids, symptom management - Fentanyl for pain control, Zofran for nausea  - ambulate as tolerated - Fleet enema  - follow-up surgery recommendations  Hypokalemia in setting of large NG outpt: Overnight pt K 3.6 and was repleted with 4 runs of K, repeat was 3.4. As pt continues to have large volume output will still need to replete.   - stat BMP/Mg (not sure why K decrease despite 4 runs) - IV KCl 66mEq x 4 (after BMP resulted; he likely is still low) - monitor BMP (twice daily 6PM, 5AM)  Tachycardia 2/2 dehydration, improving: Pt has decreased po and large NG output . Poc troponin negative. CTA originally ordered to r/o PE in the setting of active malignancy.  However CTA never done, patient w/o chest pain or hypoxia so will  hold off on CTA for now.  HR upper 90s this AM.  - IV fluids - reconsider CTA if no improvement with hydration - monitor on telemetry   AG metabolic acidosis resolved: AG 20>>16>>12 this AM, likely 2/2 to lactic acidosis 2.36 in the setting of SBO.  - treating SBO as above  - monitor lactic acid, BMP   AKI, resolved:  Unknown baseline but Cr 1.22>> 1.52>>0.87 with hydration. -  continue IV fluids - monitor BMP  UTI: Chronic foley. Patient asymptomatic. Received ceftriaxone in the ED. His son reports he has been prescribed antibiotics twice in the past month for UTI.  Most recently prescribed Levaquin for UTI and possible PNA (1 week ago).  - urine culture negative, d/c ceftriaxone   Anemia: Hgb 12.2 >> 9.5, unknown baseline. + hematemesis, neg FOBT.  Drop likely 2/2 to dilution.  - monitor CBC   Prostate Ca with mets to spine: Currently receiving Lupron and Zytiga. Urologist is in [place].  - hold above medications while patient NPO  - Fentanyl for pain control   Diet: NPO  VTE ppx: SCDs, heparin  Code: Full  Dispo: Disposition is deferred at this time, awaiting improvement of current medical problems.  Anticipated discharge in approximately 2-3 day(s).   The patient does not have a current PCP (No Pcp Per Patient) and does not know need an [facility] hospital follow-up appointment after discharge.  The patient does not know have transportation limitations that hinder transportation to clinic appointments.  .Services Needed at time of discharge: Y = Yes, Blank = No PT:   OT:   RN:   Equipment:   Other:     LOS: 3 days   [name], DO 08/15/2013, 9:36 AM

## 2013-08-16 ENCOUNTER — Inpatient Hospital Stay (HOSPITAL_COMMUNITY): Payer: Medicare Other

## 2013-08-16 LAB — BASIC METABOLIC PANEL
BUN: 11 mg/dL (ref 6–23)
BUN: 9 mg/dL (ref 6–23)
CHLORIDE: 105 meq/L (ref 96–112)
CO2: 25 mEq/L (ref 19–32)
CO2: 24 mEq/L (ref 19–32)
Calcium: 7.5 mg/dL — ABNORMAL LOW (ref 8.4–10.5)
Calcium: 7.5 mg/dL — ABNORMAL LOW (ref 8.4–10.5)
Chloride: 104 mEq/L (ref 96–112)
Creatinine, Ser: 0.81 mg/dL (ref 0.50–1.35)
Creatinine, Ser: 0.8 mg/dL (ref 0.50–1.35)
GFR calc non Af Amer: 75 mL/min — ABNORMAL LOW (ref >90)
GFR, EST AFRICAN AMERICAN: 87 mL/min — AB (ref >90)
GFR, EST AFRICAN AMERICAN: 87 mL/min — AB (ref >90)
GFR, EST NON AFRICAN AMERICAN: 75 mL/min — AB (ref >90)
GLUCOSE: 98 mg/dL (ref 70–99)
Glucose, Bld: 112 mg/dL — ABNORMAL HIGH (ref 70–99)
POTASSIUM: 3.4 meq/L — AB (ref 3.7–5.3)
POTASSIUM: 3.8 meq/L (ref 3.7–5.3)
Sodium: 141 mEq/L (ref 137–147)
Sodium: 139 mEq/L (ref 137–147)

## 2013-08-16 LAB — CBC WITH DIFFERENTIAL/PLATELET
BASOS ABS: 0 10*3/uL (ref 0.0–0.1)
Basophils Absolute: 0.1 10*3/uL (ref 0.0–0.1)
Basophils Relative: 0 % (ref 0–1)
Basophils Relative: 2 % — ABNORMAL HIGH (ref 0–1)
Eosinophils Absolute: 0.4 10*3/uL (ref 0.0–0.7)
Eosinophils Absolute: 0.4 10*3/uL (ref 0.0–0.7)
Eosinophils Relative: 6 % — ABNORMAL HIGH (ref 0–5)
Eosinophils Relative: 6 % — ABNORMAL HIGH (ref 0–5)
HCT: 28 % — ABNORMAL LOW (ref 39.0–52.0)
HEMATOCRIT: 27.1 % — AB (ref 39.0–52.0)
HEMOGLOBIN: 8.9 g/dL — AB (ref 13.0–17.0)
Hemoglobin: 8.6 g/dL — ABNORMAL LOW (ref 13.0–17.0)
LYMPHS ABS: 1.4 10*3/uL (ref 0.7–4.0)
LYMPHS PCT: 25 % (ref 12–46)
Lymphocytes Relative: 20 % (ref 12–46)
Lymphs Abs: 1.7 10*3/uL (ref 0.7–4.0)
MCH: 26.2 pg (ref 26.0–34.0)
MCH: 26.2 pg (ref 26.0–34.0)
MCHC: 31.7 g/dL (ref 30.0–36.0)
MCHC: 31.8 g/dL (ref 30.0–36.0)
MCV: 82.6 fL (ref 78.0–100.0)
MCV: 82.4 fL (ref 78.0–100.0)
MONO ABS: 0.6 10*3/uL (ref 0.1–1.0)
MONOS PCT: 8 % (ref 3–12)
Monocytes Absolute: 0.6 10*3/uL (ref 0.1–1.0)
Monocytes Relative: 9 % (ref 3–12)
NEUTROS ABS: 4.2 10*3/uL (ref 1.7–7.7)
NEUTROS ABS: 4.4 10*3/uL (ref 1.7–7.7)
NEUTROS PCT: 64 % (ref 43–77)
Neutrophils Relative %: 61 % (ref 43–77)
Platelets: 244 10*3/uL (ref 150–400)
Platelets: 237 10*3/uL (ref 150–400)
RBC: 3.28 MIL/uL — ABNORMAL LOW (ref 4.22–5.81)
RBC: 3.4 MIL/uL — AB (ref 4.22–5.81)
RDW: 17.5 % — AB (ref 11.5–15.5)
RDW: 17.3 % — ABNORMAL HIGH (ref 11.5–15.5)
WBC: 6.9 10*3/uL (ref 4.0–10.5)
WBC: 6.9 10*3/uL (ref 4.0–10.5)

## 2013-08-16 LAB — MAGNESIUM: MAGNESIUM: 1.5 mg/dL (ref 1.5–2.5)

## 2013-08-16 LAB — GLUCOSE, CAPILLARY
GLUCOSE-CAPILLARY: 109 mg/dL — AB (ref 70–99)
Glucose-Capillary: 121 mg/dL — ABNORMAL HIGH (ref 70–99)
Glucose-Capillary: 107 mg/dL — ABNORMAL HIGH (ref 70–99)
Glucose-Capillary: 95 mg/dL (ref 70–99)

## 2013-08-16 MED ORDER — POTASSIUM CHLORIDE 10 MEQ/100ML IV SOLN
10.0000 meq | INTRAVENOUS | Status: AC
  Administered 2013-08-16 (×4): 10 meq via INTRAVENOUS
  Filled 2013-08-16 (×4): qty 100

## 2013-08-16 NOTE — Consult Note (Signed)
I have been asked to see the patient by Dr. Murriel Hopper, MD, for evaluation and management of gross hematuria and metastatic prostate cancer.  History of present illness: Is a 80 year old male with past medical history of metastatic prostate cancer and chronic urinary retention who is hospitalized currently for a small bowel obstruction. Chronic urinary retention is managed by indwelling Foley catheter. During the course of his hospitalization he developed gross hematuria. His catheter stopped draining. The Foley catheter was then exchanged for an 47 [place] coud-tipped catheter overnight. The patient's hematuria currently has subsided. The Foley is draining only blood-tinged urine. For details of the patient's prostate cancer are unavailable currently. The patient has had his indwelling Foley for approximately 3 months. He has a history of urinary retention with his prostate cancer and had a TURP approximately one year ago which seemed to relieve his obstruction and allow him to void spontaneously. However, 3 months ago he began was unable to void and has since had an indwelling Foley. The patient's prostate cancer has been treated with androgen deprivation therapy only. He has not had primary therapy such as surgery or radiation. He is currently on Lupron, last injection 2 months ago, Richarda Osmond (abiraterone). These medications have been held during this hospitalization.   The patient denies any complaints. He is feeling significantly better from a small bowel obstruction. He is passing gas. Denies any nausea. He denies any abdominal or suprapubic pain. He denies any bone pain.   Review of systems: A 12 point comprehensive review of systems was obtained and is negative unless otherwise stated in the history of present illness.  Patient Active Problem List   Diagnosis Date Noted  . DM (diabetes mellitus) 08/13/2013  . UTI (lower urinary tract infection) 08/12/2013  . Small bowel obstruction  08/12/2013  . Prostate cancer 08/12/2013    No current facility-administered medications on file prior to encounter.   No current outpatient prescriptions on file prior to encounter.    Past Medical History  Diagnosis Date  . Diabetes mellitus without complication   . Prostate cancer     Past Surgical History  Procedure Laterality Date  . Abdominal aortic aneurysm repair    . Sp endovasc repair iliac art    . Inguinal hernia repair Bilateral     Open    History  Substance Use Topics  . Smoking status: Light Tobacco Smoker  . Smokeless tobacco: Not on file  . Alcohol Use: No    No family history on file.  PE: Filed Vitals:   08/14/13 2348 08/15/13 0604 08/15/13 1915 08/16/13 0434  BP: 154/76 127/64 115/62 120/69  Pulse:  96 101 91  Temp:  98.5 F (36.9 C) 100.2 F (37.9 C) 98.6 F (37 C)  TempSrc:  Oral Oral Oral  Resp:  18 18 18   Height:      Weight:      SpO2:  95%  94%   Patient appears to be in no acute distress  patient is alert and oriented x3 Atraumatic normocephalic head No cervical or supraclavicular lymphadenopathy appreciated Small firm approximately 5-10 mm lymph nodes were palpated the inguinal region bilaterally.  No increased work of breathing, no audible wheezes/rhonchi Regular sinus rhythm/rate Abdomen is soft, nontender, nondistended, no CVA or suprapubic tenderness Foley catheter is draining blood tinged urine.  Lower  extremities are symmetric without appreciable edema Grossly neurologically intact No identifiable skin lesions   Recent Labs  08/14/13 0356 08/15/13 0611 08/16/13 0840  WBC 6.5 7.0  6.9  HGB 10.1* 9.5* 8.6*  HCT 32.1* 30.0* 27.1*    Recent Labs  08/15/13 1358 08/15/13 2050 08/16/13 0800  NA 143 144 141  K 3.4* 3.7 3.4*  CL 105 107 105  CO2 27 27 25   GLUCOSE 171* 140* 112*  BUN 14 12 11   CREATININE 0.88 0.86 0.81  CALCIUM 8.2* 8.1* 7.5*   No results found for this basename: LABPT, INR,  in the last 72  hours No results found for this basename: LABURIN,  in the last 72 hours Results for orders placed during the hospital encounter of 08/12/13  URINE CULTURE     Status: None   Collection Time    08/12/13 11:25 AM      Result Value Ref Range Status   Specimen Description URINE, RANDOM   Final   Special Requests NONE   Final   Culture  Setup Time     Final   Value: 08/12/2013 18:57     Performed at [facility]     Final   Value: NO GROWTH     Performed at [facility]   Culture     Final   Value: NO GROWTH     Performed at [facility]   Report Status 08/13/2013 FINAL   Final    Imaging: CT/pelvis with oral and IV contrast  IMPRESSION:  1. Evidence of high-grade small bowel obstruction with transition to  decompressed distal small bowel in the right lower quadrant.  2. Small amount of intraperitoneal free fluid.  3. Enlarged, heterogeneous prostate with diffuse sclerotic bone  lesions, consistent with metastatic prostate cancer.  4. Partially calcified pleural plaques, suggestive of prior asbestos  exposure. These may be the etiology for the nodular opacities seen  on recent chest radiographs.  5. Abdominal Aortic and iliac artery aneurysm status post  endovascular treatment.   Imp: Gross hematuria most certainly from a Foley catheter trauma and a this appears to be resolving without significant manipulation. CT scan shows no worrisome findings within the kidney, and also confirms his likely prostate cancer with bone metastases.  Recommendations: Given that his hematuria has largely resolved, I would leave the current Foley catheter in place. He can be discharged this catheter which will then be exchanged in 4 weeks. The patient will be scheduled for followup in our office to discuss his urinary retention and metastatic prostate cancer. I've encouraged the son to stop by our office and sign a medical release so that we can obtain his records  from his prior urologist. Checking a PSA at the next lab draw would be helpful in terms of managing his prostate cancer moving forward. However, holding any current treatment is certainly reasonable.   Thank you for involving me in this patient's care, I will continue to follow along. Ardis Hughs

## 2013-08-16 NOTE — Progress Notes (Signed)
Pt's bladder scanned - 4ml - on call physician notified.

## 2013-08-16 NOTE — Progress Notes (Signed)
New foley placed per physician's order - 18 [place]. Blood tinged urine flow from new catheter. Blood continue to seep slowly from tip of penis - on call physician notified.

## 2013-08-16 NOTE — Progress Notes (Signed)
Subjective: Pt says he's doing fine, but very sleepy.  Son at bedside seems to think hes doing okay.  He denies N/V since NG tube fell out yesterday.  Says his abdomen still feels sore, but not significant pain.   He's mobilizing with PT.  Objective: Vital signs in last 24 hours: Temp:  [98.6 F (37 C)-100.2 F (37.9 C)] 98.6 F (37 C) (05/19 0434) Pulse Rate:  [91-101] 91 (05/19 0434) Resp:  [18] 18 (05/19 0434) BP: (115-120)/(62-69) 120/69 mmHg (05/19 0434) SpO2:  [94 %] 94 % (05/19 0434) Last BM Date: 2013/09/14  Intake/Output from previous day: 2022/09/15 0701 - 05/19 0700 In: 4335.4 [I.V.:4335.4] Out: 700 [Urine:300; Emesis/NG output:400] Intake/Output this shift:    PE: Gen:  Alert, NAD, pleasant Abd: Soft, mild tenderness in central and lower abdomen (maybe more than yesterday), mild distension, +BS, no HSM   Lab Results:   Recent Labs  08/14/13 0356 2013-09-14 0611  WBC 6.5 7.0  HGB 10.1* 9.5*  HCT 32.1* 30.0*  PLT 362 340   BMET  Recent Labs  14-Sep-2013 1358 09-14-2013 2050  NA 143 144  K 3.4* 3.7  CL 105 107  CO2 27 27  GLUCOSE 171* 140*  BUN 14 12  CREATININE 0.88 0.86  CALCIUM 8.2* 8.1*   PT/INR No results found for this basename: LABPROT, INR,  in the last 72 hours CMP     Component Value Date/Time   NA 144 09/14/2013 2050   K 3.7 2013/09/14 2050   CL 107 14-Sep-2013 2050   CO2 27 09/14/2013 2050   GLUCOSE 140* 09/14/13 2050   BUN 12 Sep 14, 2013 2050   CREATININE 0.86 14-Sep-2013 2050   CALCIUM 8.1* 09/14/2013 2050   PROT 7.9 08/12/2013 1050   ALBUMIN 2.7* 08/12/2013 1050   AST 27 08/12/2013 1050   ALT 6 08/12/2013 1050   ALKPHOS 335* 08/12/2013 1050   BILITOT 0.4 08/12/2013 1050   GFRNONAA 73* 14-Sep-2013 2050   GFRAA 85* 09/14/2013 2050   Lipase  No results found for this basename: lipase       Studies/Results: Dg Abd 2 Views  09-14-13   CLINICAL DATA:  Small bowel obstruction.  EXAM: ABDOMEN - 2 VIEW  COMPARISON:  Abdominal series 5/16 /2015.   CT 08/12/2013.  FINDINGS: Previously identified small bowel distention again noted, this has improved however. Stool is noted within the colon. No free air is noted. Aortoiliac stent graft noted. Coils node in the right lower pelvis. Bony structures about the abdomen and pelvis are diffusely sclerotic consistent blastic metastatic disease.  IMPRESSION: Interim partial improvement in small bowel distention.   Electronically Signed   By: Marcello Moores  Register   On: 09/14/2013 09:07    Anti-infectives: Anti-infectives   Start     Dose/Rate Route Frequency Ordered Stop   08/13/13 1400  cefTRIAXone (ROCEPHIN) 1 g in dextrose 5 % 50 mL IVPB     1 g 100 mL/hr over 30 Minutes Intravenous Every 24 hours 08/12/13 1903     08/12/13 1345  cefTRIAXone (ROCEPHIN) 1 g in dextrose 5 % 50 mL IVPB     1 g 100 mL/hr over 30 Minutes Intravenous  Once 08/12/13 1345 08/12/13 1433       Assessment/Plan Small bowel obstruction (?high grade)  Prostate cancer with h/o urinary obstruction  Diabetes  Multiple medical problems to be managed by medicine   Plan:  1. NG tube fell out yesterday.  Continue to treat conservatively for now, would not start diet  as I'm not convinced he's resolved despite him saying he feels better.  He does have some BS and has recorded 2 BM's but could likely be a result of the suppository and enema given each day.   2. NPO, bowel rest, IVF, pain control, antiemetics  3. SCD's and heparin  4. Ambulate and IS to aid in recovery  5. We may want to proceed with surgery, but patient still undecided.  Family seems like they would agree with him proceeding, but don't know if he will agree. 6. PT following. He needs to get up and move more if we have any chance to treat this conservatively.  7. Will look at today's labs and KUB to see if he's continuing to improve    LOS: 4 days    [facility] 08/16/2013, 8:02 AM Pager: [phone]

## 2013-08-16 NOTE — Progress Notes (Signed)
On call physician notified of blood clot in chronic foley and small amount of bright red blood from rectum.

## 2013-08-16 NOTE — Progress Notes (Signed)
Patient ID: Brandon Harrell, male   DOB: July 09, 1919, 80 y.o.   MRN: [id number] Attending physician note: Patient examined together with the health care team. More lethargic, less interactive, mild abdominal tenderness but no nausea or vomiting and nasogastric tube out for over 24 hours. Passing gas. We will continue conservative management for his proximal small bowel obstruction. Transient hematuria secondary to a traumatic Foley catheter insertion. Urine already clearing. Renal function remains normal. Multiple family members present. Status reviewed. Murriel Hopper, M.D., [facility]

## 2013-08-16 NOTE — Progress Notes (Signed)
Physician paged - foley irrigated - still not draining.

## 2013-08-16 NOTE — Progress Notes (Signed)
10 ml bulb deflated on present, indwelling foley catheter.  Bloody urine start to flowing, but appears to stop.  Flush attempted without success.  Foley catheter removed and18 FR foley catheter inserted without difficulty.  Approximately 100 ml bloody urine returned.

## 2013-08-16 NOTE — Progress Notes (Signed)
Abdomen is much softer. Small BM with enema. No nausea/ vomiting with NG out.  Continue conservative rx.  [name]. Georgette Dover, MD, [facility]  General/ Trauma Surgery  08/16/2013 11:53 AM

## 2013-08-16 NOTE — Progress Notes (Signed)
On call physician talk to on call urologist regarding clot in catheter, not patent after irrigating, and blood around tip of penis. On call physician request catheter to be changed - not sure if catheter is coude or not. Coude and 18 french catheter ordered - nurse that is certified to enter a coude catheter called and asked to come to bedside when catheters arrive to unit.

## 2013-08-16 NOTE — Progress Notes (Addendum)
Subjective: NG came out yesterday and surgery ok with leaving it out.  Overnight blood clot in foley, irrigated but would not drain, changed to coudet catheter per urology rec.  Hematuria noted since that time.  He was seen and examined this AM.  He seemed less talkative than prior.  He denied chest pain, dyspnea, nausea, abdominal pain.  Per chart review he had a BM yesterday.   Objective: Vital signs in last 24 hours: Filed Vitals:   08/14/13 2348 08/15/13 0604 08/15/13 1915 Aug 26, 2013 0434  BP: 154/76 127/64 115/62 120/69  Pulse:  96 101 91  Temp:  98.5 F (36.9 C) 100.2 F (37.9 C) 98.6 F (37 C)  TempSrc:  Oral Oral Oral  Resp:  18 18 18   Height:      Weight:      SpO2:  95%  94%   Weight change:   Intake/Output Summary (Last 24 hours) at 08/26/13 1327 Last data filed at 2013/08/26 1048  Gross per 24 hour  Intake 4335.42 ml  Output    550 ml  Net 3785.42 ml    Blood pressure 102/55, pulse 117, temperature 97.5 F (36.4 C), temperature source Rectal, resp. rate 20, height 5\' 7"  (1.702 m), weight 130 lb (58.968 kg), SpO2 97.00%.  General: resting in bed in NAD, drowsy but opens eyes to voice and responding verbally HEENT: no gross abnormality Cardiac: RRR, no rubs, murmurs or gallops  Pulm: CTAB, no crackles or wheezes, moving normal volumes of air  Abd: soft, nondistended, + BS all four quadrants, increased TTP RLQ, + rebound tenderness in RLQ GU:  + hematuria noted in foley bag, blood at the urethral meatus, no penile lesions noted Ext: warm and well perfused, no pedal edema Neuro: alert and oriented X3, responding appropriately  Lab Results: Basic Metabolic Panel:  Recent Labs Lab 08/15/13 2050 26-Aug-2013 0800  NA 144 141  K 3.7 3.4*  CL 107 105  CO2 27 25  GLUCOSE 140* 112*  BUN 12 11  CREATININE 0.86 0.81  CALCIUM 8.1* 7.5*  MG 1.8 1.5    CBC:  Recent Labs Lab 08/12/13 1050  08/15/13 0611 08-26-2013 0840  WBC 6.0  < > 7.0 6.9  NEUTROABS 4.5  --   --   4.2  HGB 12.2*  < > 9.5* 8.6*  HCT 38.1*  < > 30.0* 27.1*  MCV 82.1  < > 82.6 82.6  PLT 405*  < > 340 244  < > = values in this interval not displayed. CBG:  Recent Labs Lab 08/15/13 0602 08/15/13 1221 08/15/13 1756 08/26/2013 0013 2013-08-26 0618 08-26-13 1153  GLUCAP 135* 153* 129* 121* 109* 107*   Studies/Results: Dg Abd 2 Views  08/26/13   CLINICAL DATA:  80 year old male with small bowel obstruction. Initial encounter.  EXAM: ABDOMEN - 2 VIEW  COMPARISON:  08/15/2013 and earlier.  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  No pneumoperitoneum.  Continued gas-filled small bowel loops with small bowel fluid levels. Individual loops measuring up to 50 mm diameter. There is some colonic gas present as before. Stable visualized osseous structures.  IMPRESSION: 1. No free air. 2. Continued partial small bowel obstruction.   Electronically Signed   By: Lars Pinks M.D.   On: 2013/08/26 11:37   Dg Abd 2 Views  08/15/2013   CLINICAL DATA:  Small bowel obstruction.  EXAM: ABDOMEN - 2 VIEW  COMPARISON:  Abdominal series 5/16 /2015.  CT 08/12/2013.  FINDINGS: Previously identified small bowel distention again noted, this has improved however. Stool is noted within the colon. No free air is noted. Aortoiliac stent graft noted. Coils node in the right lower pelvis. Bony structures about the abdomen and pelvis are diffusely sclerotic consistent blastic metastatic disease.  IMPRESSION: Interim partial improvement in small bowel distention.   Electronically Signed   By: Marcello Moores  Register   On: 08/15/2013 09:07   Medications: I have reviewed the patient's current medications. Scheduled Meds: . cefTRIAXone (ROCEPHIN)  IV  1 g Intravenous Q24H  . heparin  5,000 Units Subcutaneous 3 times per day  . potassium chloride  10 mEq Intravenous Q1 Hr x 4  . sodium chloride  3 mL Intravenous Q12H   Continuous Infusions: . dextrose 5  % and 0.45% NaCl 1,000 mL (08/16/13 1042)   PRN Meds:.fentaNYL, ondansetron (ZOFRAN) IV  Assessment/Plan: 80 year old male with a PMH of DM and prostate CA who presents with complaint of decreased po and episode of vomiting x 4 days.   SBO: CT - High-grade small bowel obstruction w/ transition to decompressed distal small bowel in the RLQ. Surgery consulted and recommendations appreciated.  NG came out yesterday, surgery ok with leaving out. - continue conservative management: NPO, IV fluids, symptom management - Fentanyl for pain control, Zofran for nausea  - ambulate as tolerated - follow-up surgery recommendations  Hematuria, resolved:  Hematuria overnight and this AM.  Urology consulted given his metastatic prostate CA hx.  Recommendations appreciated.  Patient seen again this by primary team this AM, urine less bloody. - will leave current foley in place at d/c, to be replaced in 4 weeks.   - f/u to be arranged with Urology  Hypokalemia in setting of large NG outpt: K 3.4 this AM.  - four runs of IV KCl ordered. - monitor BMP twice daily  Tachycardia 2/2 dehydration, resolved:  Poc troponin negative. CTA originally ordered to r/o PE in the setting of active malignancy.  However CTA never done, patient w/o chest pain or hypoxia so will hold off on CTA for now.  HR 80-90s today. - IV fluids - reconsider CTA if no improvement with hydration - monitor on telemetry   AG metabolic acidosis resolved: AG 20>>16>>12>> 11this AM, likely 2/2 to lactic acidosis 2.36 in the setting of SBO.  - treating SBO as above  - monitor lactic acid, BMP   AKI, resolved:  Unknown baseline but Cr 1.22>> 1.52>>0.81 with hydration. - continue IV fluids - monitor BMP  UTI: Chronic foley. Patient asymptomatic. Received ceftriaxone in the ED. His son reports he has been prescribed antibiotics twice in the past month for UTI.  Most recently prescribed Levaquin for UTI and possible PNA (1 week ago).  - urine  culture negative, d/c ceftriaxone   Anemia: Hgb 12.2 >> 8.6, unknown baseline. Initial FOBT neg.  Drop thought to be dilutional, however had hematuria last night and this AM.  Hematuria since resolved.   - monitor CBC (twice daily, next check at 5PM) - transfuse for hgb < 8.0  Prostate Ca with mets to spine: Currently receiving Lupron and Zytiga. Urologist is in [place].  - hold above medications while patient NPO  - Fentanyl for pain control  - PSA ordered at next lab draw per Urology request  Diet: NPO  VTE ppx: SCDs, heparin  Code: Full  Dispo: Disposition is deferred at this time, awaiting improvement of current medical problems.  Anticipated discharge in approximately  2-3 day(s).   The patient does not have a current PCP (No Pcp Per Patient) and does not know need an [facility] hospital follow-up appointment after discharge.  The patient does not know have transportation limitations that hinder transportation to clinic appointments.  .Services Needed at time of discharge: Y = Yes, Blank = No PT:   OT:   RN:   Equipment:   Other:     LOS: 4 days   [name], DO 08/16/2013, 1:27 PM

## 2013-08-17 LAB — CBC WITH DIFFERENTIAL/PLATELET
Basophils Absolute: 0 10*3/uL (ref 0.0–0.1)
Basophils Absolute: 0 10*3/uL (ref 0.0–0.1)
Basophils Relative: 1 % (ref 0–1)
Basophils Relative: 1 % (ref 0–1)
EOS ABS: 0.4 10*3/uL (ref 0.0–0.7)
EOS ABS: 0.3 10*3/uL (ref 0.0–0.7)
Eosinophils Relative: 7 % — ABNORMAL HIGH (ref 0–5)
Eosinophils Relative: 5 % (ref 0–5)
HCT: 26.8 % — ABNORMAL LOW (ref 39.0–52.0)
HEMATOCRIT: 30.2 % — AB (ref 39.0–52.0)
HEMOGLOBIN: 9.5 g/dL — AB (ref 13.0–17.0)
Hemoglobin: 8.5 g/dL — ABNORMAL LOW (ref 13.0–17.0)
LYMPHS ABS: 1.3 10*3/uL (ref 0.7–4.0)
LYMPHS PCT: 23 % (ref 12–46)
LYMPHS PCT: 22 % (ref 12–46)
Lymphs Abs: 1.2 10*3/uL (ref 0.7–4.0)
MCH: 26.3 pg (ref 26.0–34.0)
MCH: 26.2 pg (ref 26.0–34.0)
MCHC: 31.7 g/dL (ref 30.0–36.0)
MCHC: 31.5 g/dL (ref 30.0–36.0)
MCV: 83 fL (ref 78.0–100.0)
MCV: 83.2 fL (ref 78.0–100.0)
MONOS PCT: 8 % (ref 3–12)
MONOS PCT: 7 % (ref 3–12)
Monocytes Absolute: 0.4 10*3/uL (ref 0.1–1.0)
Monocytes Absolute: 0.4 10*3/uL (ref 0.1–1.0)
Neutro Abs: 3.4 10*3/uL (ref 1.7–7.7)
Neutro Abs: 3.8 10*3/uL (ref 1.7–7.7)
Neutrophils Relative %: 61 % (ref 43–77)
Neutrophils Relative %: 65 % (ref 43–77)
PLATELETS: 260 10*3/uL (ref 150–400)
Platelets: 219 10*3/uL (ref 150–400)
RBC: 3.23 MIL/uL — AB (ref 4.22–5.81)
RBC: 3.63 MIL/uL — AB (ref 4.22–5.81)
RDW: 17.5 % — ABNORMAL HIGH (ref 11.5–15.5)
RDW: 17.2 % — ABNORMAL HIGH (ref 11.5–15.5)
WBC: 5.4 10*3/uL (ref 4.0–10.5)
WBC: 5.8 10*3/uL (ref 4.0–10.5)

## 2013-08-17 LAB — BASIC METABOLIC PANEL
BUN: 8 mg/dL (ref 6–23)
BUN: 7 mg/dL (ref 6–23)
CALCIUM: 7.5 mg/dL — AB (ref 8.4–10.5)
CHLORIDE: 101 meq/L (ref 96–112)
CO2: 25 meq/L (ref 19–32)
CO2: 24 mEq/L (ref 19–32)
Calcium: 7.2 mg/dL — ABNORMAL LOW (ref 8.4–10.5)
Chloride: 103 mEq/L (ref 96–112)
Creatinine, Ser: 0.78 mg/dL (ref 0.50–1.35)
Creatinine, Ser: 0.79 mg/dL (ref 0.50–1.35)
GFR calc Af Amer: 88 mL/min — ABNORMAL LOW (ref >90)
GFR calc Af Amer: 88 mL/min — ABNORMAL LOW (ref >90)
GFR calc non Af Amer: 76 mL/min — ABNORMAL LOW (ref >90)
GFR calc non Af Amer: 76 mL/min — ABNORMAL LOW (ref >90)
GLUCOSE: 104 mg/dL — AB (ref 70–99)
GLUCOSE: 87 mg/dL (ref 70–99)
POTASSIUM: 3.3 meq/L — AB (ref 3.7–5.3)
Potassium: 3.6 mEq/L — ABNORMAL LOW (ref 3.7–5.3)
SODIUM: 138 meq/L (ref 137–147)
SODIUM: 136 meq/L — AB (ref 137–147)

## 2013-08-17 LAB — GLUCOSE, CAPILLARY
GLUCOSE-CAPILLARY: 93 mg/dL (ref 70–99)
GLUCOSE-CAPILLARY: 81 mg/dL (ref 70–99)
Glucose-Capillary: 109 mg/dL — ABNORMAL HIGH (ref 70–99)
Glucose-Capillary: 114 mg/dL — ABNORMAL HIGH (ref 70–99)
Glucose-Capillary: 82 mg/dL (ref 70–99)

## 2013-08-17 LAB — OCCULT BLOOD X 1 CARD TO LAB, STOOL: FECAL OCCULT BLD: NEGATIVE

## 2013-08-17 MED ORDER — FLEET ENEMA 7-19 GM/118ML RE ENEM
1.0000 | ENEMA | Freq: Once | RECTAL | Status: DC
  Filled 2013-08-17: qty 1

## 2013-08-17 MED ORDER — POTASSIUM CHLORIDE 10 MEQ/100ML IV SOLN
10.0000 meq | INTRAVENOUS | Status: AC
  Administered 2013-08-17 (×4): 10 meq via INTRAVENOUS
  Filled 2013-08-17 (×4): qty 100

## 2013-08-17 MED ORDER — POTASSIUM CHLORIDE 10 MEQ/100ML IV SOLN
10.0000 meq | INTRAVENOUS | Status: AC
  Administered 2013-08-17 (×2): 10 meq via INTRAVENOUS
  Filled 2013-08-17 (×2): qty 100

## 2013-08-17 NOTE — Progress Notes (Signed)
Patient ID: Brandon Harrell, male   DOB: 1919-12-14, 80 y.o.   MRN: [id number] Attending physician note: Patient examined. I concur with the assessment and plan outlined in the progress note by resident physician Dr. [name]. No nausea or vomiting. Abdomen remains soft and nontender. He is starting to take small amounts of clear liquids. His son is present and I updated him on his father's status. We are uncertain of the etiology of his bowel obstruction. He has had no prior abdominal surgeries. It would be unusual for prostate cancer to metastasize to the intestine. We have been fortunate in that his condition has stabilized with conservative therapy, however, I told the son that we would likely see recurrent problems in the future. Murriel Hopper, M.D., [place] [place]

## 2013-08-17 NOTE — Progress Notes (Signed)
Utilization review completed.  

## 2013-08-17 NOTE — Consult Note (Signed)
Urology follow-up Continue foley to gravity PSA today Son has signed medical records release form for Urology records, I will review these and follow-up with patient in clinic. We will contact family for f/u appointment.  Please page with any additional questions or concerns.

## 2013-08-17 NOTE — Progress Notes (Signed)
Subjective: Pt feels good today.  He's had his NG tube out for 2 whole days without N/V.  He had 2 BM's yesterday after enema.  Started to be hungry - wants chicken!Marland Kitchen  KUB showed improvement in BO yesterday.  Son at bedside feels he's doing much better.  He's having flatus as well.    Objective: Vital signs in last 24 hours: Temp:  [98.6 F (37 C)-99.1 F (37.3 C)] 99.1 F (37.3 C) (05/20 0610) Pulse Rate:  [87-92] 92 (05/20 0610) Resp:  [18-20] 18 (05/20 0610) BP: (113-123)/(61-64) 120/61 mmHg (05/20 0610) SpO2:  [90 %-91 %] 90 % (05/20 0610) Last BM Date: September 02, 2013  Intake/Output from previous day: Sep 03, 2022 0701 - 05/20 0700 In: 3400 [I.V.:3000; IV Piggyback:400] Out: 500 [Urine:500] Intake/Output this shift:    PE: Gen:  Alert, NAD, pleasant Abd: Soft, minimal tenderness, ND, +BS, no HSM   Lab Results:   Recent Labs  September 02, 2013 1658 08/17/13 0547  WBC 6.9 5.4  HGB 8.9* 8.5*  HCT 28.0* 26.8*  PLT 237 219   BMET  Recent Labs  09/02/13 1658 08/17/13 0547  NA 139 138  K 3.8 3.3*  CL 104 103  CO2 24 25  GLUCOSE 98 104*  BUN 9 8  CREATININE 0.80 0.78  CALCIUM 7.5* 7.2*   PT/INR No results found for this basename: LABPROT, INR,  in the last 72 hours CMP     Component Value Date/Time   NA 138 08/17/2013 0547   K 3.3* 08/17/2013 0547   CL 103 08/17/2013 0547   CO2 25 08/17/2013 0547   GLUCOSE 104* 08/17/2013 0547   BUN 8 08/17/2013 0547   CREATININE 0.78 08/17/2013 0547   CALCIUM 7.2* 08/17/2013 0547   PROT 7.9 08/12/2013 1050   ALBUMIN 2.7* 08/12/2013 1050   AST 27 08/12/2013 1050   ALT 6 08/12/2013 1050   ALKPHOS 335* 08/12/2013 1050   BILITOT 0.4 08/12/2013 1050   GFRNONAA 76* 08/17/2013 0547   GFRAA 88* 08/17/2013 0547   Lipase  No results found for this basename: lipase       Studies/Results: Dg Abd 2 Views  2013/09/02   CLINICAL DATA:  80 year old male with small bowel obstruction. Initial encounter.  EXAM: ABDOMEN - 2 VIEW  COMPARISON:  08/15/2013  and earlier.  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  No pneumoperitoneum.  Continued gas-filled small bowel loops with small bowel fluid levels. Individual loops measuring up to 50 mm diameter. There is some colonic gas present as before. Stable visualized osseous structures.  IMPRESSION: 1. No free air. 2. Continued partial small bowel obstruction.   Electronically Signed   By: Lars Pinks M.D.   On: Sep 02, 2013 11:37   Dg Abd 2 Views  08/15/2013   CLINICAL DATA:  Small bowel obstruction.  EXAM: ABDOMEN - 2 VIEW  COMPARISON:  Abdominal series 5/16 /2015.  CT 08/12/2013.  FINDINGS: Previously identified small bowel distention again noted, this has improved however. Stool is noted within the colon. No free air is noted. Aortoiliac stent graft noted. Coils node in the right lower pelvis. Bony structures about the abdomen and pelvis are diffusely sclerotic consistent blastic metastatic disease.  IMPRESSION: Interim partial improvement in small bowel distention.   Electronically Signed   By: Marcello Moores  Register   On: 08/15/2013 09:07    Anti-infectives: Anti-infectives   Start     Dose/Rate Route Frequency Ordered Stop   08/13/13 1400  cefTRIAXone (ROCEPHIN) 1 g in dextrose 5 % 50 mL IVPB  Status:  Discontinued     1 g 100 mL/hr over 30 Minutes Intravenous Every 24 hours 08/12/13 1903 08/16/13 1349   08/12/13 1345  cefTRIAXone (ROCEPHIN) 1 g in dextrose 5 % 50 mL IVPB     1 g 100 mL/hr over 30 Minutes Intravenous  Once 08/12/13 1345 08/12/13 1433       Assessment/Plan Small bowel obstruction (?high grade)  Prostate cancer with h/o urinary obstruction and bony metastasis Diabetes  Multiple medical problems to be managed by medicine   Plan:  1. NG tube fell out 2 days ago. Had 2 BM's yesterday.  Start clear liquids and see how he does 2. IVF, pain control, antiemetics, low threshold to replace NG tube or consider OR  if the patients symptoms return with initiation of liquids. 3. SCD's and heparin  4. Ambulate and IS to aid in recovery  5. If does not continue to improve may need operation, but patient still undecided. Family seems like they would agree with him proceeding, but don't know if he will agree.  6. PT following. He needs to get up and move more if we have any chance to treat this conservatively.       LOS: 5 days    Coralie Keens 08/17/2013, 7:56 AM Pager: [phone]

## 2013-08-17 NOTE — Progress Notes (Signed)
Subjective: No acute events overnight.  Brandon Harrell was seen and examined this AM.  He is feeling better, hungry.  + flatus/BM, no N/V.  Objective: Vital signs in last 24 hours: Filed Vitals:   09-12-13 0434 12-Sep-2013 1333 12-Sep-2013 2304 08/17/13 0610  BP: 120/69 123/63 113/64 120/61  Pulse: 91 87 89 92  Temp: 98.6 F (37 C) 98.9 F (37.2 C) 98.6 F (37 C) 99.1 F (37.3 C)  TempSrc: Oral Oral Oral Oral  Resp: 18 18 20 18   Height:      Weight:      SpO2: 94% 90% 91% 90%   Weight change:   Intake/Output Summary (Last 24 hours) at 08/17/13 1149 Last data filed at 08/17/13 0927  Gross per 24 hour  Intake   3400 ml  Output   1100 ml  Net   2300 ml    Blood pressure 102/55, pulse 117, temperature 97.5 F (36.4 C), temperature source Rectal, resp. rate 20, height 5\' 7"  (1.702 m), weight 130 lb (58.968 kg), SpO2 97.00%.  General: resting in bed in NAD, awake and alert HEENT: no gross abnormality Cardiac: RRR, no rubs, murmurs or gallops  Pulm: CTAB anteriorly, moving normal volumes of air  Abd: soft, nondistended, + BS all four quadrants, mild TTP RLQ, no rebound/guarding Ext: warm and well perfused, no pedal edema Neuro: alert and oriented X3, responding appropriately  Lab Results: Basic Metabolic Panel:  Recent Labs Lab 08/15/13 2050 09/12/2013 0800 09-12-13 1658 08/17/13 0547  NA 144 141 139 138  K 3.7 3.4* 3.8 3.3*  CL 107 105 104 103  CO2 27 25 24 25   GLUCOSE 140* 112* 98 104*  BUN 12 11 9 8   CREATININE 0.86 0.81 0.80 0.78  CALCIUM 8.1* 7.5* 7.5* 7.2*  MG 1.8 1.5  --   --     CBC:  Recent Labs Lab 2013-09-12 1658 08/17/13 0547  WBC 6.9 5.4  NEUTROABS 4.4 3.4  HGB 8.9* 8.5*  HCT 28.0* 26.8*  MCV 82.4 83.0  PLT 237 219   CBG:  Recent Labs Lab Sep 12, 2013 0618 Sep 12, 2013 1153 09/12/2013 1732 08/17/13 0007 08/17/13 0606 08/17/13 1136  GLUCAP 109* 107* 95 109* 93 82   Studies/Results: Dg Abd 2 Views  September 12, 2013   CLINICAL DATA:  80 year old male with  small bowel obstruction. Initial encounter.  EXAM: ABDOMEN - 2 VIEW  COMPARISON:  08/15/2013 and earlier.  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  No pneumoperitoneum.  Continued gas-filled small bowel loops with small bowel fluid levels. Individual loops measuring up to 50 mm diameter. There is some colonic gas present as before. Stable visualized osseous structures.  IMPRESSION: 1. No free air. 2. Continued partial small bowel obstruction.   Electronically Signed   By: Lars Pinks M.D.   On: 2013/09/12 11:37   Medications: I have reviewed the patient's current medications. Scheduled Meds: . heparin  5,000 Units Subcutaneous 3 times per day  . potassium chloride  10 mEq Intravenous Q1 Hr x 4  . sodium chloride  3 mL Intravenous Q12H  . sodium phosphate  1 enema Rectal Once   Continuous Infusions: . dextrose 5 % and 0.45% NaCl 125 mL/hr (08/17/13 1049)   PRN Meds:.fentaNYL, ondansetron (ZOFRAN) IV  Assessment/Plan: 80 year old male with a PMH of DM and prostate CA who presents with complaint of decreased po and episode of vomiting x 4 days.   SBO: CT - High-grade small  bowel obstruction w/ transition to decompressed distal small bowel in the RLQ. Surgery consulted and recommendations appreciated.  NG came out, surgery ok with leaving out.  Improved symptoms and exam today.   - start clear liquid diet   - continue conservative management: IV fluids, symptom management - Fentanyl for pain control, Zofran for nausea  - ambulate as tolerated - follow-up surgery recommendations  Hematuria, resolved:  Hematuria during admission.  Urology consulted given his metastatic prostate CA hx.  Recommendations appreciated.   - will leave current foley in place at d/c, to be replaced in 4 weeks.   - f/u to be arranged with Urology  Hypokalemia in setting of large NG outpt: K 3.3 this AM.  - four runs of IV KCl ordered. -  monitor BMP twice daily  Tachycardia 2/2 dehydration, resolved:  Poc troponin negative. CTA originally ordered to r/o PE in the setting of active malignancy.  However CTA never done, patient w/o chest pain or hypoxia so will hold off on CTA for now.  HR 80-90s today. - IV fluids - reconsider CTA if no improvement with hydration - monitor on telemetry   AG metabolic acidosis resolved: AG 20>>16>>12>> 10, likely 2/2 to lactic acidosis 2.36 in the setting of SBO.  - treating SBO as above  - monitor lactic acid, BMP   AKI, resolved:  Unknown baseline but Cr 1.22>> 1.52>>>0.78 with hydration. - continue IV fluids - monitor BMP  UTI: Chronic foley. Patient asymptomatic. Received ceftriaxone in the ED. His son reports he has been prescribed antibiotics twice in the past month for UTI.  Most recently prescribed Levaquin for UTI and possible PNA (1 week ago).  - urine culture negative, d/c ceftriaxone   Anemia: Hgb 12.2 >> 8.5, unknown baseline. Initial FOBT neg.  Drop thought to be dilutional, however had hematuria.  Hematuria since resolved.   - monitor CBC (twice daily, next check at 5PM) - transfuse for hgb < 8.0  Prostate Ca with mets to spine: Currently receiving Lupron and Zytiga. Urologist is in [place].  - hold above medications until patient tolerating po - Fentanyl for pain control  - PSA pending  Diet: clear liquids VTE ppx: SCDs, heparin  Code: Full  Dispo: Disposition is deferred at this time, awaiting improvement of current medical problems.  Anticipated discharge in approximately 2-3 day(s).   The patient does not have a current PCP (No Pcp Per Patient) and does not know need an [facility] hospital follow-up appointment after discharge.  The patient does not know have transportation limitations that hinder transportation to clinic appointments.  .Services Needed at time of discharge: Y = Yes, Blank = No PT:   OT:   RN:   Equipment:   Other:     LOS: 5 days   [name], DO 08/17/2013, 11:49 AM

## 2013-08-17 NOTE — Progress Notes (Signed)
Service response called to refill hand sanitizer in room.

## 2013-08-17 NOTE — Progress Notes (Signed)
Abdomen very soft. Patient awake, alert Hungry - wants some chicken soup  SBO seems to be resolving. Clear liquids  [name]. Georgette Dover, MD, [facility] Surgery  General/ Trauma Surgery  08/17/2013 10:58 AM

## 2013-08-17 NOTE — Progress Notes (Signed)
On call physician notified of bleeding at tip of penis when pt moved back into bed.

## 2013-08-18 LAB — BASIC METABOLIC PANEL
BUN: 6 mg/dL (ref 6–23)
BUN: 6 mg/dL (ref 6–23)
CALCIUM: 7.3 mg/dL — AB (ref 8.4–10.5)
CHLORIDE: 104 meq/L (ref 96–112)
CO2: 22 mEq/L (ref 19–32)
CO2: 23 meq/L (ref 19–32)
Calcium: 7.4 mg/dL — ABNORMAL LOW (ref 8.4–10.5)
Chloride: 103 mEq/L (ref 96–112)
Creatinine, Ser: 0.8 mg/dL (ref 0.50–1.35)
Creatinine, Ser: 0.78 mg/dL (ref 0.50–1.35)
GFR calc non Af Amer: 76 mL/min — ABNORMAL LOW (ref >90)
GFR, EST AFRICAN AMERICAN: 87 mL/min — AB (ref >90)
GFR, EST AFRICAN AMERICAN: 88 mL/min — AB (ref >90)
GFR, EST NON AFRICAN AMERICAN: 75 mL/min — AB (ref >90)
Glucose, Bld: 101 mg/dL — ABNORMAL HIGH (ref 70–99)
Glucose, Bld: 81 mg/dL (ref 70–99)
POTASSIUM: 3.5 meq/L — AB (ref 3.7–5.3)
POTASSIUM: 3.5 meq/L — AB (ref 3.7–5.3)
SODIUM: 138 meq/L (ref 137–147)
Sodium: 138 mEq/L (ref 137–147)

## 2013-08-18 LAB — CBC WITH DIFFERENTIAL/PLATELET
BASOS ABS: 0 10*3/uL (ref 0.0–0.1)
Basophils Absolute: 0 10*3/uL (ref 0.0–0.1)
Basophils Relative: 1 % (ref 0–1)
Basophils Relative: 1 % (ref 0–1)
EOS ABS: 0.3 10*3/uL (ref 0.0–0.7)
EOS PCT: 7 % — AB (ref 0–5)
Eosinophils Absolute: 0.3 10*3/uL (ref 0.0–0.7)
Eosinophils Relative: 7 % — ABNORMAL HIGH (ref 0–5)
HCT: 26.6 % — ABNORMAL LOW (ref 39.0–52.0)
HEMATOCRIT: 24.9 % — AB (ref 39.0–52.0)
HEMOGLOBIN: 8.5 g/dL — AB (ref 13.0–17.0)
HEMOGLOBIN: 7.9 g/dL — AB (ref 13.0–17.0)
LYMPHS ABS: 0.9 10*3/uL (ref 0.7–4.0)
LYMPHS PCT: 22 % (ref 12–46)
Lymphocytes Relative: 21 % (ref 12–46)
Lymphs Abs: 1 10*3/uL (ref 0.7–4.0)
MCH: 26.4 pg (ref 26.0–34.0)
MCH: 25.8 pg — ABNORMAL LOW (ref 26.0–34.0)
MCHC: 32 g/dL (ref 30.0–36.0)
MCHC: 31.7 g/dL (ref 30.0–36.0)
MCV: 82.6 fL (ref 78.0–100.0)
MCV: 81.4 fL (ref 78.0–100.0)
MONO ABS: 0.5 10*3/uL (ref 0.1–1.0)
MONOS PCT: 13 % — AB (ref 3–12)
MONOS PCT: 11 % (ref 3–12)
Monocytes Absolute: 0.6 10*3/uL (ref 0.1–1.0)
NEUTROS ABS: 2.7 10*3/uL (ref 1.7–7.7)
NEUTROS ABS: 2.5 10*3/uL (ref 1.7–7.7)
NEUTROS PCT: 58 % (ref 43–77)
Neutrophils Relative %: 59 % (ref 43–77)
PLATELETS: 230 10*3/uL (ref 150–400)
Platelets: 206 10*3/uL (ref 150–400)
RBC: 3.22 MIL/uL — AB (ref 4.22–5.81)
RBC: 3.06 MIL/uL — AB (ref 4.22–5.81)
RDW: 17.3 % — ABNORMAL HIGH (ref 11.5–15.5)
RDW: 17.3 % — ABNORMAL HIGH (ref 11.5–15.5)
WBC: 4.7 10*3/uL (ref 4.0–10.5)
WBC: 4.2 10*3/uL (ref 4.0–10.5)

## 2013-08-18 LAB — GLUCOSE, CAPILLARY
GLUCOSE-CAPILLARY: 107 mg/dL — AB (ref 70–99)
GLUCOSE-CAPILLARY: 69 mg/dL — AB (ref 70–99)
GLUCOSE-CAPILLARY: 84 mg/dL (ref 70–99)
Glucose-Capillary: 101 mg/dL — ABNORMAL HIGH (ref 70–99)

## 2013-08-18 LAB — PSA: PSA: 396.6 ng/mL — ABNORMAL HIGH (ref <=4.00)

## 2013-08-18 MED ORDER — POTASSIUM CHLORIDE CRYS ER 20 MEQ PO TBCR
40.0000 meq | EXTENDED_RELEASE_TABLET | Freq: Once | ORAL | Status: AC
  Administered 2013-08-18: 40 meq via ORAL
  Filled 2013-08-18: qty 2

## 2013-08-18 NOTE — Progress Notes (Signed)
Patient ID: Brandon Harrell, male   DOB: 06-14-1919, 80 y.o.   MRN: [id number] Attending physician note: Patient was examined together with the health care team. Status and management plan accurate as recorded in the progress note by resident physician Dr. Percival Spanish. Mr. Abbey continues to improve. He was started on a full liquid diet today. Anticipate we will be able to let him go home soon. Murriel Hopper, M.D., [place]

## 2013-08-18 NOTE — Progress Notes (Signed)
PT Cancellation Note  Patient Details Name: Brandon Harrell MRN: [id number] DOB: [birth year]   Cancelled Treatment:    Reason Eval/Treat Not Completed: Patient declined, no reason specified. Patient declined out of bed at this time. Attempting to encourage patient to sit up to eat lunch however patient again decline. Will reattempt later as able.    Tonia Brooms Theordore Cisnero 08/18/2013, 1:24 PM

## 2013-08-18 NOTE — Progress Notes (Signed)
More BM Abdomen very soft, Advance diet as tolerated SBO seems to be resolved without surgery.  [name]. Georgette Dover, MD, [facility] Surgery  General/ Trauma Surgery  08/18/2013 10:30 AM

## 2013-08-18 NOTE — Progress Notes (Addendum)
Subjective: No acute events overnight.  Mr. Brandon Harrell was seen and examined this AM.  He is feeling better, tolerated clear liquids yesterday.  +BM, no N/V or abd pain.    Objective: Vital signs in last 24 hours: Filed Vitals:   08/17/13 0610 08/17/13 1359 08/17/13 2036 08/18/13 0621  BP: 120/61 119/67 114/48 114/56  Pulse: 92 92 93 89  Temp: 99.1 F (37.3 C) 98.3 F (36.8 C) 97.8 F (36.6 C) 98.5 F (36.9 C)  TempSrc: Oral Oral Oral Axillary  Resp: 18 18 20 20   Height:      Weight:      SpO2: 90%  93% 93%   Weight change:   Intake/Output Summary (Last 24 hours) at 08/18/13 0748 Last data filed at 08/18/13 2202  Gross per 24 hour  Intake   3120 ml  Output   2175 ml  Net    945 ml    Blood pressure 102/55, pulse 117, temperature 97.5 F (36.4 C), temperature source Rectal, resp. rate 20, height 5\' 7"  (1.702 m), weight 130 lb (58.968 kg), SpO2 97.00%.  General: sitting up in bed in NAD, awake and alert eating breakfast (full liquids) with minimal assist (able to handle his coffee cup independently) HEENT: no gross abnormality Cardiac: RRR, no rubs, murmurs or gallops  Pulm: CTAB anteriorly, moving normal volumes of air  Abd: soft, nondistended, + BS all four quadrants, non-tender, no rebound/guarding GU:  Clear, light yellow urine in foley bag Ext: warm and well perfused, no pedal edema Neuro: alert and oriented X3, responding appropriately,able to  Lab Results: Basic Metabolic Panel:  Recent Labs Lab 08/15/13 2050 09/07/13 0800  08/17/13 0547 08/17/13 1630  NA 144 141  < > 138 136*  K 3.7 3.4*  < > 3.3* 3.6*  CL 107 105  < > 103 101  CO2 27 25  < > 25 24  GLUCOSE 140* 112*  < > 104* 87  BUN 12 11  < > 8 7  CREATININE 0.86 0.81  < > 0.Brandon 0.79  CALCIUM 8.1* 7.5*  < > 7.2* 7.5*  MG 1.8 1.5  --   --   --   < > = values in this interval not displayed.  CBC:  Recent Labs Lab 08/17/13 1630 08/18/13 0611  WBC 5.8 4.7  NEUTROABS 3.8 2.7  HGB 9.5* 8.5*  HCT 30.2*  26.6*  MCV 83.2 82.6  PLT 260 230   CBG:  Recent Labs Lab 08/17/13 0007 08/17/13 0606 08/17/13 1136 08/17/13 1708 08/17/13 2324 08/18/13 0615  GLUCAP 109* 93 82 81 114* 101*   Studies/Results: Dg Abd 2 Views  09-07-2013   CLINICAL DATA:  Brandon Harrell with small bowel obstruction. Initial encounter.  EXAM: ABDOMEN - 2 VIEW  COMPARISON:  08/15/2013 and earlier.  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  No pneumoperitoneum.  Continued gas-filled small bowel loops with small bowel fluid levels. Individual loops measuring up to 50 mm diameter. There is some colonic gas present as before. Stable visualized osseous structures.  IMPRESSION: 1. No free air. 2. Continued partial small bowel obstruction.   Electronically Signed   By: Lars Pinks M.D.   On: 2013-09-07 11:37   Medications: I have reviewed the patient's current medications. Scheduled Meds: . heparin  5,000 Units Subcutaneous 3 times per day  . sodium chloride  3 mL Intravenous Q12H   Continuous Infusions: none   PRN Meds:.fentaNYL, ondansetron (ZOFRAN)  IV  Assessment/Plan: Brandon Harrell with a PMH of DM and prostate CA who presents with complaint of decreased po and episode of vomiting x 4 days.   SBO, resolved: CT - High-grade small bowel obstruction w/ transition to decompressed distal small bowel in the RLQ. Surgery consulted and recommendations appreciated.  NG came out, surgery ok with leaving out.  SBO seems resolved. - advance diet to full, then advance as tolerated - d/c IV fluids, he seems to be taking good po (seen eating and drinking coffee this AM) - Fentanyl for pain control, Zofran for nausea  - ambulate as tolerated - follow-up surgery recommendations  Hematuria, resolved:  Hematuria during admission.  Urology consulted given his metastatic prostate CA hx.  Recommendations appreciated.   - will leave current foley in place  at d/c, to be replaced in 4 weeks.   - f/u to be arranged with Urology  Hypokalemia in setting of large NG outpt: K 3.5 this AM.  - Kdur 62mEq ordered - monitor BMP twice daily  Tachycardia 2/2 dehydration, resolved:  Poc troponin negative. CTA originally ordered to r/o PE in the setting of active malignancy.  However CTA never done, patient w/o chest pain or hypoxia so will hold off on CTA for now.  HR 89 this AM.  - d/c IV fluids since he is tolerating po - d/c telemetry   AG metabolic acidosis resolved: AG 20>>16>>12>> 13, likely 2/2 to lactic acidosis 2.36 in the setting of SBO.  - treated SBO as above  - monitor BMP  AKI, resolved:  Unknown baseline but Cr 1.22>> 1.52>>>0.80 with hydration. - monitor BMP  UTI: Chronic foley. Patient asymptomatic. Received ceftriaxone in the ED. His son reports he has been prescribed antibiotics twice in the past month for UTI.  Most recently prescribed Levaquin for UTI and possible PNA (1 week ago).  - urine culture negative, d/c ceftriaxone   Anemia: Hgb 12.2 >> 8.5, unknown baseline. Initial FOBT neg.  Drop thought to be dilutional, however had hematuria.  Hematuria since resolved.   - monitor CBC (twice daily, next check at 5PM) - transfuse for hgb < 8.0  Prostate Ca with mets to spine: Currently receiving Lupron and Zytiga. Urologist is in [place].  - continue to hold Zytiga (not on hospital formulary) - Fentanyl for pain control  - PSA pending  Diet: full liquids VTE ppx: SCDs, heparin  Code: Full  Dispo: Disposition is deferred at this time, awaiting improvement of current medical problems.  Anticipated discharge in approximately 1-2 day(s).   The patient does not have a current PCP (No Pcp Per Patient) and does need an [name] hospital follow-up appointment after discharge.  The patient does not know have transportation limitations that hinder transportation to clinic appointments.  .Services Needed at time of discharge: Y = Yes,  Blank = No PT:   OT:   RN:   Equipment:   Other:     LOS: 6 days   [name], DO 08/18/2013, 7:48 AM

## 2013-08-18 NOTE — Progress Notes (Signed)
  Subjective: +BM/passing flatus.  Tolerated clears.   Objective: Vital signs in last 24 hours: Temp:  [97.8 F (36.6 C)-98.5 F (36.9 C)] 98.5 F (36.9 C) (05/21 0621) Pulse Rate:  [89-93] 89 (05/21 0621) Resp:  [18-20] 20 (05/21 0621) BP: (114-119)/(48-67) 114/56 mmHg (05/21 0621) SpO2:  [93 %] 93 % (05/21 0621) Last BM Date: 08/17/13  Intake/Output from previous day: 05/20 0701 - 05/21 0700 In: 3120 [P.O.:120; I.V.:3000] Out: 2175 [Urine:2175] Intake/Output this shift:   PE General appearance: alert, cooperative and no distress GI: soft, non-tender; bowel sounds normal; no masses,  no organomegaly  Lab Results:   Recent Labs  08/17/13 1630 08/18/13 0611  WBC 5.8 4.7  HGB 9.5* 8.5*  HCT 30.2* 26.6*  PLT 260 230   BMET  Recent Labs  08/17/13 1630 08/18/13 0611  NA 136* 138  K 3.6* 3.5*  CL 101 103  CO2 24 22  GLUCOSE 87 101*  BUN 7 6  CREATININE 0.79 0.80  CALCIUM 7.5* 7.3*   PT/INR No results found for this basename: LABPROT, INR,  in the last 72 hours ABG No results found for this basename: PHART, PCO2, PO2, HCO3,  in the last 72 hours  Studies/Results: Dg Abd 2 Views  Aug 23, 2013   CLINICAL DATA:  80 year old male with small bowel obstruction. Initial encounter.  EXAM: ABDOMEN - 2 VIEW  COMPARISON:  08/15/2013 and earlier.  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  No pneumoperitoneum.  Continued gas-filled small bowel loops with small bowel fluid levels. Individual loops measuring up to 50 mm diameter. There is some colonic gas present as before. Stable visualized osseous structures.  IMPRESSION: 1. No free air. 2. Continued partial small bowel obstruction.   Electronically Signed   By: Lars Pinks M.D.   On: 23-Aug-2013 11:37    Anti-infectives: Anti-infectives   Start     Dose/Rate Route Frequency Ordered Stop   08/13/13 1400  cefTRIAXone (ROCEPHIN) 1 g in dextrose 5  % 50 mL IVPB  Status:  Discontinued     1 g 100 mL/hr over 30 Minutes Intravenous Every 24 hours 08/12/13 1903 Aug 23, 2013 1349   08/12/13 1345  cefTRIAXone (ROCEPHIN) 1 g in dextrose 5 % 50 mL IVPB     1 g 100 mL/hr over 30 Minutes Intravenous  Once 08/12/13 1345 08/12/13 1433     Assessment/Plan  Small bowel obstruction (?high grade)  Prostate cancer with h/o urinary obstruction and bony metastasis  Diabetes  Multiple medical problems to be managed by medicine   Plan:  -resolved SBO -advance to full liquid diet -Ambulate and IS to aid in recovery  -further management per primary team, will follow    LOS: 6 days    Nasier Thumm  ANP-BC Pager [phone]  08/18/2013  8:31 AM

## 2013-08-19 DIAGNOSIS — R196 Halitosis: Secondary | ICD-10-CM

## 2013-08-19 DIAGNOSIS — R198 Other specified symptoms and signs involving the digestive system and abdomen: Secondary | ICD-10-CM

## 2013-08-19 LAB — CBC WITH DIFFERENTIAL/PLATELET
BASOS ABS: 0 10*3/uL (ref 0.0–0.1)
Basophils Absolute: 0 10*3/uL (ref 0.0–0.1)
Basophils Relative: 1 % (ref 0–1)
Basophils Relative: 0 % (ref 0–1)
EOS ABS: 0.3 10*3/uL (ref 0.0–0.7)
Eosinophils Absolute: 0.3 10*3/uL (ref 0.0–0.7)
Eosinophils Relative: 7 % — ABNORMAL HIGH (ref 0–5)
Eosinophils Relative: 5 % (ref 0–5)
HCT: 26 % — ABNORMAL LOW (ref 39.0–52.0)
HCT: 27.8 % — ABNORMAL LOW (ref 39.0–52.0)
Hemoglobin: 8.3 g/dL — ABNORMAL LOW (ref 13.0–17.0)
Hemoglobin: 9.1 g/dL — ABNORMAL LOW (ref 13.0–17.0)
LYMPHS ABS: 1.2 10*3/uL (ref 0.7–4.0)
LYMPHS PCT: 24 % (ref 12–46)
Lymphocytes Relative: 28 % (ref 12–46)
Lymphs Abs: 1.2 10*3/uL (ref 0.7–4.0)
MCH: 26.4 pg (ref 26.0–34.0)
MCH: 26.8 pg (ref 26.0–34.0)
MCHC: 31.9 g/dL (ref 30.0–36.0)
MCHC: 32.7 g/dL (ref 30.0–36.0)
MCV: 82.8 fL (ref 78.0–100.0)
MCV: 81.8 fL (ref 78.0–100.0)
MONOS PCT: 11 % (ref 3–12)
Monocytes Absolute: 0.5 10*3/uL (ref 0.1–1.0)
Monocytes Absolute: 0.6 10*3/uL (ref 0.1–1.0)
Monocytes Relative: 13 % — ABNORMAL HIGH (ref 3–12)
NEUTROS ABS: 2.1 10*3/uL (ref 1.7–7.7)
NEUTROS PCT: 51 % (ref 43–77)
NEUTROS PCT: 60 % (ref 43–77)
Neutro Abs: 2.9 10*3/uL (ref 1.7–7.7)
PLATELETS: 214 10*3/uL (ref 150–400)
Platelets: 188 10*3/uL (ref 150–400)
RBC: 3.14 MIL/uL — AB (ref 4.22–5.81)
RBC: 3.4 MIL/uL — ABNORMAL LOW (ref 4.22–5.81)
RDW: 17.2 % — ABNORMAL HIGH (ref 11.5–15.5)
RDW: 17.2 % — AB (ref 11.5–15.5)
WBC: 4.2 10*3/uL (ref 4.0–10.5)
WBC: 5 10*3/uL (ref 4.0–10.5)

## 2013-08-19 LAB — BASIC METABOLIC PANEL
BUN: 6 mg/dL (ref 6–23)
BUN: 7 mg/dL (ref 6–23)
CALCIUM: 7.4 mg/dL — AB (ref 8.4–10.5)
CALCIUM: 7.7 mg/dL — AB (ref 8.4–10.5)
CO2: 23 mEq/L (ref 19–32)
CO2: 23 mEq/L (ref 19–32)
CREATININE: 0.85 mg/dL (ref 0.50–1.35)
Chloride: 107 mEq/L (ref 96–112)
Chloride: 104 mEq/L (ref 96–112)
Creatinine, Ser: 0.86 mg/dL (ref 0.50–1.35)
GFR calc Af Amer: 85 mL/min — ABNORMAL LOW (ref >90)
GFR calc non Af Amer: 73 mL/min — ABNORMAL LOW (ref >90)
GFR, EST AFRICAN AMERICAN: 85 mL/min — AB (ref >90)
GFR, EST NON AFRICAN AMERICAN: 73 mL/min — AB (ref >90)
Glucose, Bld: 75 mg/dL (ref 70–99)
Glucose, Bld: 97 mg/dL (ref 70–99)
POTASSIUM: 3.8 meq/L (ref 3.7–5.3)
Potassium: 3.7 mEq/L (ref 3.7–5.3)
SODIUM: 139 meq/L (ref 137–147)
Sodium: 138 mEq/L (ref 137–147)

## 2013-08-19 LAB — GLUCOSE, CAPILLARY
GLUCOSE-CAPILLARY: 71 mg/dL (ref 70–99)
GLUCOSE-CAPILLARY: 93 mg/dL (ref 70–99)
Glucose-Capillary: 80 mg/dL (ref 70–99)
Glucose-Capillary: 112 mg/dL — ABNORMAL HIGH (ref 70–99)

## 2013-08-19 MED ORDER — LACTATED RINGERS IV BOLUS (SEPSIS): 1000.0000 mL | Freq: Three times a day (TID) | INTRAVENOUS | Status: DC | PRN

## 2013-08-19 MED ORDER — ALUM & MAG HYDROXIDE-SIMETH 200-200-20 MG/5ML PO SUSP: 30.0000 mL | Freq: Four times a day (QID) | ORAL | Status: DC | PRN

## 2013-08-19 MED ORDER — ONDANSETRON 4 MG PO TBDP
4.0000 mg | ORAL_TABLET | Freq: Three times a day (TID) | ORAL | Status: DC | PRN
  Filled 2013-08-19: qty 1

## 2013-08-19 MED ORDER — BISACODYL 10 MG RE SUPP: 10.0000 mg | Freq: Two times a day (BID) | RECTAL | Status: DC | PRN

## 2013-08-19 MED ORDER — ENSURE COMPLETE PO LIQD
237.0000 mL | Freq: Two times a day (BID) | ORAL | Status: DC
  Administered 2013-08-19 – 2013-08-22 (×2): 237 mL via ORAL

## 2013-08-19 MED ORDER — MAGIC MOUTHWASH
15.0000 mL | Freq: Four times a day (QID) | ORAL | Status: DC | PRN
  Filled 2013-08-19: qty 15

## 2013-08-19 MED ORDER — LIP MEDEX EX OINT
1.0000 "application " | TOPICAL_OINTMENT | Freq: Two times a day (BID) | CUTANEOUS | Status: DC
  Filled 2013-08-19: qty 7

## 2013-08-19 MED ORDER — BLISTEX MEDICATED EX OINT
TOPICAL_OINTMENT | Freq: Two times a day (BID) | CUTANEOUS | Status: DC
  Administered 2013-08-19 (×2): via TOPICAL
  Administered 2013-08-20 – 2013-08-22 (×5): 1 via TOPICAL
  Filled 2013-08-19 (×2): qty 10

## 2013-08-19 NOTE — Progress Notes (Signed)
Physical Therapy Treatment Patient Details Name: Brandon Harrell MRN: [id number] DOB: 1919/11/12 Today's Date: 08/19/2013    History of Present Illness Patient is a 80 yo male admitted 08/12/13 with N/V.  Patient with SBO, being treated conservatively at this time.  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    PT Comments    Pt very pleasant and willing to work and walk today. On arrival pt in recliner sliding out of chair with buttock almost off chair with son present. Pt cued for repositioning prior to transfers and needs mod cues throughout all mobility for sequence and safety. Son/caregiver present who states he would prefer ST-SNF at this point until pt can return to supervision level for decreased burden of care at home as he doesn't feel family can provide current needs for pt. Pt and son educated for mobility, HEP and progression. Pt assisted with pericare and lower body bathing after incontinent stool which pt and son also report as being new.   Follow Up Recommendations  Supervision/Assistance - 24 hour;SNF     Equipment Recommendations  None recommended by PT    Recommendations for Other Services       Precautions / Restrictions Precautions Precautions: Fall Precaution Comments: incontinent of stool    Mobility  Bed Mobility               General bed mobility comments: pt in recliner on arrival  Transfers Overall transfer level: Needs assistance Equipment used: Rolling walker (2 wheeled) Transfers: Sit to/from Stand Sit to Stand: Min guard         General transfer comment: cues for hand placement and safety  Ambulation/Gait Ambulation/Gait assistance: Min assist Ambulation Distance (Feet): 90 Feet Assistive device: Rolling walker (2 wheeled) Gait Pattern/deviations: Shuffle;Narrow base of support;Trunk flexed Gait velocity: Decreased   General Gait Details: bilateral knees flexed throughout gait, vc's for posture , position in RW, looking up  and safety. Gait limited by incontinent stool with pt unaware. With transfers chair to toilet pt with increased assist to direct RW increased difficulty with turning and maintaining proximity to [employer] Mobility    Modified Rankin (Stroke Patients Only)       Balance Overall balance assessment: Needs assistance   Sitting balance-Leahy Scale: Fair       Standing balance-Leahy Scale: Poor                      Cognition Arousal/Alertness: Awake/alert Behavior During Therapy: WFL for tasks assessed/performed Overall Cognitive Status: History of cognitive impairments - at baseline                      Exercises General Exercises - Lower Extremity Long Arc Quad: AROM;Both;Seated;15 reps Hip ABduction/ADduction: AROM;Seated;Both;15 reps Hip Flexion/Marching: AROM;Both;Seated;15 reps Toe Raises: AROM;Seated;Both;15 reps    General Comments        Pertinent Vitals/Pain No pain    Home Living                      Prior Function            PT Goals (current goals can now be found in the care plan section) Progress towards PT goals: Progressing toward goals    Frequency       PT Plan Discharge plan needs to be updated    Co-evaluation  End of Session Equipment Utilized During Treatment: Gait belt Activity Tolerance: Patient tolerated treatment well Patient left: in chair;with call bell/phone within reach;with chair alarm set;with family/visitor present     Time: 7619-5093 PT Time Calculation (min): 38 min  Charges:  $Gait Training: 8-22 mins $Therapeutic Exercise: 8-22 mins $Therapeutic Activity: 8-22 mins                    G Codes:      Tedric Leeth B Jalessa Peyser Sep 06, 2013, 2:13 PM Elwyn Reach, [place]

## 2013-08-19 NOTE — Plan of Care (Signed)
Problem: Phase I Progression Outcomes Goal: Initial discharge plan identified Outcome: Completed/Met Date Met:  08/19/13 Pt. Needs ST-SNF for rehab  Problem: Phase II Progression Outcomes Goal: Progress activity as tolerated unless otherwise ordered Outcome: Completed/Met Date Met:  08/19/13 OOB to chair and walked in halls with PT.

## 2013-08-19 NOTE — Progress Notes (Signed)
Blair, MD, FACS   [address] [place], [place] [address] Phone: [phone] FAX: [phone]    Brandon Harrell [id number] [birth year]  CARE TEAM:  PCP: No PCP Per Patient  Outpatient Care Team: Patient Care Team: No Pcp Per Patient as PCP - General (General Practice)  Inpatient Treatment Team: Treatment Team: Attending Provider: Annia Belt, MD; Consulting Physician: Nolon Nations, MD; Rounding Team: [place] (Rounding), MD; Technician: Vergia Alcon, NT; Consulting Physician: Ardis Hughs, MD; Physical Therapist: Lamarr Lulas, PT   Subjective:  Feeling better Tolerating full liquids  Objective:  Vital signs:  Filed Vitals:   08/18/13 1519 08/18/13 2233 08/19/13 0503 08/19/13 0612  BP: 125/66 107/62 168/86 113/69  Pulse: 86 89 98 84  Temp: 97.8 F (36.6 C) 98.7 F (37.1 C) 99 F (37.2 C)   TempSrc: Oral Oral Oral   Resp: _0 Height:      Weight:      SpO2: 93% 91% 96%     Last BM Date: 08/17/13  Intake/Output   Yesterday:  05/21 0701 - 05/22 0700 In: 240 [P.O.:240] Out: 900 [Urine:900] This shift:     Bowel function:  Flatus: y  BM: y  Drain: n/a  Physical Exam:  General: Pt awake/alert/oriented x4 in no acute distress Eyes: PERRL, normal EOM.  Sclera clear.  No icterus Neuro: CN II-XII intact w/o focal sensory/motor deficits. Lymph: No head/neck/groin lymphadenopathy Psych:  No delerium/psychosis/paranoia HENT: Normocephalic, Mucus membranes moist.  No thrush.  Bad halitosis Neck: Supple, No tracheal deviation Chest: No chest wall pain w good excursion CV:  Pulses intact.  Regular rhythm MS: Normal AROM mjr joints.  No obvious deformity Abdomen: Soft.  Nondistended.  Soft nontender.  No evidence of peritonitis.  No incarcerated hernias. Ext:  SCDs BLE.  No mjr edema.  No cyanosis Skin: No petechiae / purpura   Problem List:   Principal Problem:   Small bowel obstruction Active Problems:   UTI (lower urinary tract infection)   Prostate cancer   DM (diabetes mellitus)   Assessment  Brandon Harrell  80 y.o. male       SBO resolving  Plan:  -solid diet -VTE prophylaxis- SCDs, etc -mobilize as tolerated to help recovery  The patient is stable.  There is no evidence of peritonitis, acute abdomen, nor shock.  There is no strong evidence of failure of improvement nor decline with current non-operative management.  There is no need for surgery at the present moment.  We will continue to follow.   Adin Hector, M.D., F.A.C.S. Gastrointestinal and Minimally Invasive Surgery [facility] [address], [place], [place] [address] [phone] Main / Paging   08/19/2013   Results:   Labs: Results for orders placed during the hospital encounter of 08/12/13 (from the past 48 hour(s))  GLUCOSE, CAPILLARY     Status: None   Collection Time    08/17/13 11:36 AM      Result Value Ref Range   Glucose-Capillary 82  70 - 99 mg/dL  BASIC METABOLIC PANEL     Status: Abnormal   Collection Time    08/17/13  4:30 PM      Result Value Ref Range   Sodium 136 (*) 137 - 147 mEq/L   Potassium 3.6 (*) 3.7 - 5.3 mEq/L   Chloride 101  96 - 112 mEq/L   CO2  24  19 - 32 mEq/L   Glucose, Bld 87  70 - 99 mg/dL   BUN 7  6 - 23 mg/dL   Creatinine, Ser 0.79  0.50 - 1.35 mg/dL   Calcium 7.5 (*) 8.4 - 10.5 mg/dL   GFR calc non Af Amer 76 (*) >90 mL/min   GFR calc Af Amer 88 (*) >90 mL/min   Comment: (NOTE)     The eGFR has been calculated using the CKD EPI equation.     This calculation has not been validated in all clinical situations.     eGFR's persistently <90 mL/min signify possible Chronic Kidney     Disease.  CBC WITH DIFFERENTIAL     Status: Abnormal   Collection Time    08/17/13  4:30 PM      Result Value Ref Range   WBC 5.8  4.0 - 10.5 K/uL   RBC 3.63 (*) 4.22 - 5.81 MIL/uL   Hemoglobin 9.5 (*)  13.0 - 17.0 g/dL   HCT 30.2 (*) 39.0 - 52.0 %   MCV 83.2  78.0 - 100.0 fL   MCH 26.2  26.0 - 34.0 pg   MCHC 31.5  30.0 - 36.0 g/dL   RDW 17.2 (*) 11.5 - 15.5 %   Platelets 260  150 - 400 K/uL   Neutrophils Relative % 65  43 - 77 %   Neutro Abs 3.8  1.7 - 7.7 K/uL   Lymphocytes Relative 22  12 - 46 %   Lymphs Abs 1.3  0.7 - 4.0 K/uL   Monocytes Relative 7  3 - 12 %   Monocytes Absolute 0.4  0.1 - 1.0 K/uL   Eosinophils Relative 5  0 - 5 %   Eosinophils Absolute 0.3  0.0 - 0.7 K/uL   Basophils Relative 1  0 - 1 %   Basophils Absolute 0.0  0.0 - 0.1 K/uL  GLUCOSE, CAPILLARY     Status: None   Collection Time    08/17/13  5:08 PM      Result Value Ref Range   Glucose-Capillary 81  70 - 99 mg/dL  GLUCOSE, CAPILLARY     Status: Abnormal   Collection Time    08/17/13 11:24 PM      Result Value Ref Range   Glucose-Capillary 114 (*) 70 - 99 mg/dL   Comment 1 Notify RN     Comment 2 Documented in Chart    BASIC METABOLIC PANEL     Status: Abnormal   Collection Time    08/18/13  6:11 AM      Result Value Ref Range   Sodium 138  137 - 147 mEq/L   Potassium 3.5 (*) 3.7 - 5.3 mEq/L   Chloride 103  96 - 112 mEq/L   CO2 22  19 - 32 mEq/L   Glucose, Bld 101 (*) 70 - 99 mg/dL   BUN 6  6 - 23 mg/dL   Creatinine, Ser 0.80  0.50 - 1.35 mg/dL   Calcium 7.3 (*) 8.4 - 10.5 mg/dL   GFR calc non Af Amer 75 (*) >90 mL/min   GFR calc Af Amer 87 (*) >90 mL/min   Comment: (NOTE)     The eGFR has been calculated using the CKD EPI equation.     This calculation has not been validated in all clinical situations.     eGFR's persistently <90 mL/min signify possible Chronic Kidney     Disease.  CBC WITH DIFFERENTIAL  Status: Abnormal   Collection Time    08/18/13  6:11 AM      Result Value Ref Range   WBC 4.7  4.0 - 10.5 K/uL   RBC 3.22 (*) 4.22 - 5.81 MIL/uL   Hemoglobin 8.5 (*) 13.0 - 17.0 g/dL   HCT 26.6 (*) 39.0 - 52.0 %   MCV 82.6  78.0 - 100.0 fL   MCH 26.4  26.0 - 34.0 pg   MCHC 32.0   30.0 - 36.0 g/dL   RDW 17.3 (*) 11.5 - 15.5 %   Platelets 230  150 - 400 K/uL   Neutrophils Relative % 58  43 - 77 %   Neutro Abs 2.7  1.7 - 7.7 K/uL   Lymphocytes Relative 21  12 - 46 %   Lymphs Abs 1.0  0.7 - 4.0 K/uL   Monocytes Relative 13 (*) 3 - 12 %   Monocytes Absolute 0.6  0.1 - 1.0 K/uL   Eosinophils Relative 7 (*) 0 - 5 %   Eosinophils Absolute 0.3  0.0 - 0.7 K/uL   Basophils Relative 1  0 - 1 %   Basophils Absolute 0.0  0.0 - 0.1 K/uL  GLUCOSE, CAPILLARY     Status: Abnormal   Collection Time    08/18/13  6:15 AM      Result Value Ref Range   Glucose-Capillary 101 (*) 70 - 99 mg/dL   Comment 1 Notify RN     Comment 2 Documented in Chart    GLUCOSE, CAPILLARY     Status: Abnormal   Collection Time    08/18/13 12:07 PM      Result Value Ref Range   Glucose-Capillary 107 (*) 70 - 99 mg/dL   Comment 1 Documented in Chart     Comment 2 Notify RN    GLUCOSE, CAPILLARY     Status: Abnormal   Collection Time    08/18/13  5:56 PM      Result Value Ref Range   Glucose-Capillary 69 (*) 70 - 99 mg/dL  BASIC METABOLIC PANEL     Status: Abnormal   Collection Time    08/18/13  6:00 PM      Result Value Ref Range   Sodium 138  137 - 147 mEq/L   Potassium 3.5 (*) 3.7 - 5.3 mEq/L   Chloride 104  96 - 112 mEq/L   CO2 23  19 - 32 mEq/L   Glucose, Bld 81  70 - 99 mg/dL   BUN 6  6 - 23 mg/dL   Creatinine, Ser 0.78  0.50 - 1.35 mg/dL   Calcium 7.4 (*) 8.4 - 10.5 mg/dL   GFR calc non Af Amer 76 (*) >90 mL/min   GFR calc Af Amer 88 (*) >90 mL/min   Comment: (NOTE)     The eGFR has been calculated using the CKD EPI equation.     This calculation has not been validated in all clinical situations.     eGFR's persistently <90 mL/min signify possible Chronic Kidney     Disease.  CBC WITH DIFFERENTIAL     Status: Abnormal   Collection Time    08/18/13  6:00 PM      Result Value Ref Range   WBC 4.2  4.0 - 10.5 K/uL   RBC 3.06 (*) 4.22 - 5.81 MIL/uL   Hemoglobin 7.9 (*) 13.0 -  17.0 g/dL   HCT 24.9 (*) 39.0 - 52.0 %   MCV 81.4  78.0 -  100.0 fL   MCH 25.8 (*) 26.0 - 34.0 pg   MCHC 31.7  30.0 - 36.0 g/dL   RDW 17.3 (*) 11.5 - 15.5 %   Platelets 206  150 - 400 K/uL   Neutrophils Relative % 59  43 - 77 %   Neutro Abs 2.5  1.7 - 7.7 K/uL   Lymphocytes Relative 22  12 - 46 %   Lymphs Abs 0.9  0.7 - 4.0 K/uL   Monocytes Relative 11  3 - 12 %   Monocytes Absolute 0.5  0.1 - 1.0 K/uL   Eosinophils Relative 7 (*) 0 - 5 %   Eosinophils Absolute 0.3  0.0 - 0.7 K/uL   Basophils Relative 1  0 - 1 %   Basophils Absolute 0.0  0.0 - 0.1 K/uL  GLUCOSE, CAPILLARY     Status: None   Collection Time    08/18/13  6:54 PM      Result Value Ref Range   Glucose-Capillary 84  70 - 99 mg/dL  GLUCOSE, CAPILLARY     Status: None   Collection Time    08/19/13  1:04 AM      Result Value Ref Range   Glucose-Capillary 71  70 - 99 mg/dL   Comment 1 Documented in Chart     Comment 2 Notify RN    GLUCOSE, CAPILLARY     Status: None   Collection Time    08/19/13  5:20 AM      Result Value Ref Range   Glucose-Capillary 80  70 - 99 mg/dL   Comment 1 Documented in Chart     Comment 2 Notify RN    BASIC METABOLIC PANEL     Status: Abnormal   Collection Time    08/19/13  6:52 AM      Result Value Ref Range   Sodium 139  137 - 147 mEq/L   Potassium 3.8  3.7 - 5.3 mEq/L   Chloride 107  96 - 112 mEq/L   CO2 23  19 - 32 mEq/L   Glucose, Bld 75  70 - 99 mg/dL   BUN 6  6 - 23 mg/dL   Creatinine, Ser 0.86  0.50 - 1.35 mg/dL   Calcium 7.4 (*) 8.4 - 10.5 mg/dL   GFR calc non Af Amer 73 (*) >90 mL/min   GFR calc Af Amer 85 (*) >90 mL/min   Comment: (NOTE)     The eGFR has been calculated using the CKD EPI equation.     This calculation has not been validated in all clinical situations.     eGFR's persistently <90 mL/min signify possible Chronic Kidney     Disease.  CBC WITH DIFFERENTIAL     Status: Abnormal   Collection Time    08/19/13  6:52 AM      Result Value Ref Range   WBC 4.2   4.0 - 10.5 K/uL   RBC 3.14 (*) 4.22 - 5.81 MIL/uL   Hemoglobin 8.3 (*) 13.0 - 17.0 g/dL   HCT 26.0 (*) 39.0 - 52.0 %   MCV 82.8  78.0 - 100.0 fL   MCH 26.4  26.0 - 34.0 pg   MCHC 31.9  30.0 - 36.0 g/dL   RDW 17.2 (*) 11.5 - 15.5 %   Platelets 188  150 - 400 K/uL   Neutrophils Relative % 51  43 - 77 %   Neutro Abs 2.1  1.7 - 7.7 K/uL   Lymphocytes Relative 28  12 - 46 %   Lymphs Abs 1.2  0.7 - 4.0 K/uL   Monocytes Relative 13 (*) 3 - 12 %   Monocytes Absolute 0.5  0.1 - 1.0 K/uL   Eosinophils Relative 7 (*) 0 - 5 %   Eosinophils Absolute 0.3  0.0 - 0.7 K/uL   Basophils Relative 1  0 - 1 %   Basophils Absolute 0.0  0.0 - 0.1 K/uL    Imaging / Studies: No results found.  Medications / Allergies: per chart  Antibiotics: Anti-infectives   Start     Dose/Rate Route Frequency Ordered Stop   08/13/13 1400  cefTRIAXone (ROCEPHIN) 1 g in dextrose 5 % 50 mL IVPB  Status:  Discontinued     1 g 100 mL/hr over 30 Minutes Intravenous Every 24 hours 08/12/13 1903 08/16/13 1349   08/12/13 1345  cefTRIAXone (ROCEPHIN) 1 g in dextrose 5 % 50 mL IVPB     1 g 100 mL/hr over 30 Minutes Intravenous  Once 08/12/13 1345 08/12/13 1433       Note: This dictation was prepared with Dragon/digital dictation along with [employer]. Any transcriptional errors that result from this process are unintentional.

## 2013-08-19 NOTE — Progress Notes (Addendum)
Subjective: No acute events overnight.  Brandon Harrell was seen and examined this AM.  He is feeling better, tolerated full liquids yesterday.   Had 2 loose, non-bloody BMs this AM, patient feels good, denies fever/chills, abd pain or N/V.  Just ate breakfast with coffee.  Objective: Vital signs in last 24 hours: Filed Vitals:   08/18/13 1519 08/18/13 2233 08/19/13 0503 08/19/13 0612  BP: 125/66 107/62 168/86 113/69  Pulse: 86 89 98 84  Temp: 97.8 F (36.6 C) 98.7 F (37.1 C) 99 F (37.2 C)   TempSrc: Oral Oral Oral   Resp: 20 20 20    Height:      Weight:      SpO2: 93% 91% 96%    Weight change:   Intake/Output Summary (Last 24 hours) at 08/19/13 1036 Last data filed at 08/19/13 0504  Gross per 24 hour  Intake    240 ml  Output    900 ml  Net   -660 ml    Blood pressure 102/55, pulse 117, temperature 97.5 F (36.4 C), temperature source Rectal, resp. rate 20, height 5\' 7"  (1.702 m), weight 130 lb (58.968 kg), SpO2 97.00%.  General: sitting up in chair in NAD, awake and alert HEENT: no gross abnormality Cardiac: RRR, no rubs, murmurs or gallops  Pulm: CTAB anteriorly, moving normal volumes of air  Abd: soft, nondistended, + BS all four quadrants, non-tender, no rebound/guarding Ext: warm and well perfused, no pedal edema Neuro: alert and oriented X3, responding appropriately  Lab Results: Basic Metabolic Panel:  Recent Labs Lab 08/15/13 2050 08/16/13 0800  08/18/13 1800 08/19/13 0652  NA 144 141  < > 138 139  K 3.7 3.4*  < > 3.5* 3.8  CL 107 105  < > 104 107  CO2 27 25  < > 23 23  GLUCOSE 140* 112*  < > 81 75  BUN 12 11  < > 6 6  CREATININE 0.86 0.81  < > 0.78 0.86  CALCIUM 8.1* 7.5*  < > 7.4* 7.4*  MG 1.8 1.5  --   --   --   < > = values in this interval not displayed.  CBC:  Recent Labs Lab 08/18/13 1800 08/19/13 0652  WBC 4.2 4.2  NEUTROABS 2.5 2.1  HGB 7.9* 8.3*  HCT 24.9* 26.0*  MCV 81.4 82.8  PLT 206 188   CBG:  Recent Labs Lab 08/18/13 0615  08/18/13 1207 08/18/13 1756 08/18/13 1854 08/19/13 0104 08/19/13 0520  GLUCAP 101* 107* 69* 84 71 80   Studies/Results: No results found. Medications: I have reviewed the patient's current medications. Scheduled Meds: . feeding supplement (ENSURE COMPLETE)  237 mL Oral BID BM  . heparin  5,000 Units Subcutaneous 3 times per day  . lip balm   Topical BID  . sodium chloride  3 mL Intravenous Q12H   Continuous Infusions: none   PRN Meds:.alum & mag hydroxide-simeth, bisacodyl, fentaNYL, lactated ringers, magic mouthwash, ondansetron (ZOFRAN) IV  Assessment/Plan: 80 year old male with a PMH of DM and prostate CA who presents with complaint of decreased po and episode of vomiting x 4 days.   SBO, resolved: CT - High-grade small bowel obstruction w/ transition to decompressed distal small bowel in the RLQ. Surgery following and recommendations appreciated.  NG came out, surgery ok with leaving out.  SBO seems resolved. - advance diet to bland - Fentanyl for pain control, Zofran for nausea  - ambulate with PT as tolerated; PT has recommended home  health PT/24 hour assistance - will discuss dispo plans with family since nursing staff report patient requires 2 person assist; patient has just moved to [place] 1 week prior to admission so I will talk to family about their plans (i.e is someone home to offer assistance/supervision) and if he will need SNF.  Loose BM:  2 loose, non-bloody BMs this AM. Patient afebrile, no leukocytosis.  He just ate breakfast followed by coffee and has been on liquid diet the past two days which probably contributed to loose BMs this AM.   - consider c.diff testing if this does not resolve  Hematuria, resolved:  Hematuria during admission.  Urology consulted given his metastatic prostate CA hx.  Recommendations appreciated.   - will leave current foley in place at d/c, to be replaced in 4 weeks.   - f/u to be arranged with Urology  Hypokalemia in setting of large  NG outpt, resolved: K 3.8 this AM.  - monitor BMP daily  Tachycardia 2/2 dehydration, resolved:  Poc troponin negative. CTA originally ordered to r/o PE in the setting of active malignancy.  However CTA never done, patient w/o chest pain or hypoxia so will hold off on CTA for now.   - d/c telemetry   AG metabolic acidosis resolved: AG 20>>16>>12>> 9, likely 2/2 to lactic acidosis 2.36 in the setting of SBO.  - treating SBO as above  - monitor BMP  AKI, resolved:  Unknown baseline but Cr 1.22>> 1.52>>>0.86 with hydration. - monitor BMP  UTI: Chronic foley. Patient asymptomatic. Received ceftriaxone in the ED. His son reports he has been prescribed antibiotics twice in the past month for UTI.  Most recently prescribed Levaquin for UTI and possible PNA (1 week ago).  - urine culture negative, ceftriaxone discontinued  Anemia: Hgb 12.2 >> 8.3, unknown baseline. Initial FOBT neg.  Drop thought to be dilutional, however had hematuria.  Hematuria since resolved.   - monitor CBC (twice daily) - transfuse for hgb < 8.0  Prostate Ca with mets to spine: Currently receiving Lupron and Zytiga. Urologist is in [place].  Current PSA 396.60. - continue to hold Zytiga (not on hospital formulary) - Fentanyl for pain control   Diet: bland diet VTE ppx: SCDs, heparin  Code: Full  Dispo: Disposition is deferred at this time, awaiting improvement of current medical problems.  Anticipated discharge in approximately 1-2 day(s).   The patient does not have a current PCP (No Pcp Per Patient) and does need an [facility] hospital follow-up appointment after discharge.  The patient does not know have transportation limitations that hinder transportation to clinic appointments.  .Services Needed at time of discharge: Y = Yes, Blank = No PT:   OT:   RN:   Equipment:   Other:     LOS: 7 days   Brandon Maxin, DO 08/19/2013, 10:36 AM Attending physician Physical findings and status accurate as recorded above by  resident physician Dr. [name]. The patient had 2 loose bowel movements today. This is actually a positive finding given recent bowel obstruction. He is deconditioned. We will involve physical and occupational therapy. Resident discussed with the family short-term rehabilitation facility and they are in agreement. Will have care management assist in placing him. Brandon Hopper, MD, [place]  Hematology-Oncology/Internal Medicine

## 2013-08-19 NOTE — Progress Notes (Signed)
NUTRITION FOLLOW-UP  DOCUMENTATION CODES Per approved criteria  -Severe malnutrition in the context of chronic illness   Pt meets criteria for severe MALNUTRITION in the context of chronic illness as evidenced by severe depletion of muscle mass and body fat.   INTERVENTION: Add Ensure Complete po BID, each supplement provides 350 kcal and 13 grams of protein RD to continue to follow nutrition care plan   NUTRITION DIAGNOSIS: Malnutrition related to poor PO intake, appetite as evidenced by severe depletion of muscle mass and body fat. Ongoing.  Goal: Pt to meet >/=90% of estimated nutrition needs - improving  Monitor:  Po intake, weight trend, labs   ASSESSMENT: Brandon Harrell is 80 year old male with a PMH of DM and met prostate CA (on Lupron and Zytiga) who presents with complaint decreased po and episode of dark brown vomiting. Pt has not eaten in about 4 days.   Pt states he drinks chocolate Boost at home. When diet advances, will order Ensure Complete. Encouraged pt to stay hydrated and drink plenty of fluids.   NG tube removed 5/18. Per chart, SBO seems to be resolved with conservative treatment.  Advanced to bland diet as of 5/22. Pt reports that he is tolerating his diet well.  CBG's: 71 - 84 Potassium WNL  Height: Ht Readings from Last 1 Encounters:  08/12/13 _0  (1.702 m)    Weight: Wt Readings from Last 1 Encounters:  08/12/13 130 lb 3.2 oz (59.058 kg)   BMI:  Body mass index is 20.39 kg/(m^2)., Normal   Estimated Nutritional Needs: Kcal: 1500 - 1700 Protein: 65 - 75 grams  Fluid: 1.5 - 1.7 L/day   Skin: intact  Diet Order: [name]    Intake/Output Summary (Last 24 hours) at 08/19/13 1053 Last data filed at 08/19/13 0504  Gross per 24 hour  Intake    240 ml  Output    900 ml  Net   -660 ml    Last BM: 5/20  Labs:   Recent Labs Lab 08/15/13 1358 08/15/13 2050 08/16/13 0800  08/18/13 0611 08/18/13 1800 08/19/13 0652  NA 143 144 141  < >  138 138 139  K 3.4* 3.7 3.4*  < > 3.5* 3.5* 3.8  CL 105 107 105  < > 103 104 107  CO2 _1 < > _2 BUN _3 < > _4 CREATININE 0.88 0.86 0.81  < > 0.80 0.78 0.86  CALCIUM 8.2* 8.1* 7.5*  < > 7.3* 7.4* 7.4*  MG 1.7 1.8 1.5  --   --   --   --   GLUCOSE 171* 140* 112*  < > 101* 81 75  < > = values in this interval not displayed.  CBG (last 3)   Recent Labs  08/18/13 1854 08/19/13 0104 08/19/13 0520  GLUCAP 84 71 80    Scheduled Meds: . heparin  5,000 Units Subcutaneous 3 times per day  . lip balm   Topical BID  . sodium chloride  3 mL Intravenous Q12H    Continuous Infusions:    Inda Coke MS, RD, LDN Inpatient Registered Dietitian Pager: [phone] After-hours pager: [phone]

## 2013-08-20 LAB — CBC WITH DIFFERENTIAL/PLATELET
BASOS ABS: 0.1 10*3/uL (ref 0.0–0.1)
Basophils Absolute: 0 10*3/uL (ref 0.0–0.1)
Basophils Relative: 1 % (ref 0–1)
Basophils Relative: 0 % (ref 0–1)
EOS ABS: 0.3 10*3/uL (ref 0.0–0.7)
EOS PCT: 6 % — AB (ref 0–5)
Eosinophils Absolute: 0.3 10*3/uL (ref 0.0–0.7)
Eosinophils Relative: 6 % — ABNORMAL HIGH (ref 0–5)
HCT: 26.5 % — ABNORMAL LOW (ref 39.0–52.0)
HCT: 26.8 % — ABNORMAL LOW (ref 39.0–52.0)
Hemoglobin: 8.5 g/dL — ABNORMAL LOW (ref 13.0–17.0)
Hemoglobin: 8.7 g/dL — ABNORMAL LOW (ref 13.0–17.0)
LYMPHS ABS: 1.3 10*3/uL (ref 0.7–4.0)
LYMPHS PCT: 24 % (ref 12–46)
Lymphocytes Relative: 28 % (ref 12–46)
Lymphs Abs: 1.5 10*3/uL (ref 0.7–4.0)
MCH: 26.3 pg (ref 26.0–34.0)
MCH: 26.7 pg (ref 26.0–34.0)
MCHC: 32.1 g/dL (ref 30.0–36.0)
MCHC: 32.5 g/dL (ref 30.0–36.0)
MCV: 82 fL (ref 78.0–100.0)
MCV: 82.2 fL (ref 78.0–100.0)
MONOS PCT: 11 % (ref 3–12)
Monocytes Absolute: 0.6 10*3/uL (ref 0.1–1.0)
Monocytes Absolute: 0.6 10*3/uL (ref 0.1–1.0)
Monocytes Relative: 11 % (ref 3–12)
NEUTROS ABS: 2.8 10*3/uL (ref 1.7–7.7)
Neutro Abs: 3.1 10*3/uL (ref 1.7–7.7)
Neutrophils Relative %: 54 % (ref 43–77)
Neutrophils Relative %: 59 % (ref 43–77)
Platelets: 240 10*3/uL (ref 150–400)
Platelets: 240 10*3/uL (ref 150–400)
RBC: 3.23 MIL/uL — ABNORMAL LOW (ref 4.22–5.81)
RBC: 3.26 MIL/uL — AB (ref 4.22–5.81)
RDW: 17 % — AB (ref 11.5–15.5)
RDW: 17.2 % — AB (ref 11.5–15.5)
WBC: 5.3 10*3/uL (ref 4.0–10.5)
WBC: 5.3 10*3/uL (ref 4.0–10.5)

## 2013-08-20 LAB — BASIC METABOLIC PANEL
BUN: 7 mg/dL (ref 6–23)
BUN: 8 mg/dL (ref 6–23)
CHLORIDE: 105 meq/L (ref 96–112)
CO2: 24 meq/L (ref 19–32)
CO2: 25 meq/L (ref 19–32)
CREATININE: 0.92 mg/dL (ref 0.50–1.35)
CREATININE: 0.9 mg/dL (ref 0.50–1.35)
Calcium: 7.7 mg/dL — ABNORMAL LOW (ref 8.4–10.5)
Calcium: 7.6 mg/dL — ABNORMAL LOW (ref 8.4–10.5)
Chloride: 104 mEq/L (ref 96–112)
GFR calc Af Amer: 82 mL/min — ABNORMAL LOW (ref >90)
GFR calc Af Amer: 83 mL/min — ABNORMAL LOW (ref >90)
GFR calc non Af Amer: 71 mL/min — ABNORMAL LOW (ref >90)
GFR calc non Af Amer: 72 mL/min — ABNORMAL LOW (ref >90)
Glucose, Bld: 98 mg/dL (ref 70–99)
Glucose, Bld: 111 mg/dL — ABNORMAL HIGH (ref 70–99)
Potassium: 3.7 mEq/L (ref 3.7–5.3)
Potassium: 3.6 mEq/L — ABNORMAL LOW (ref 3.7–5.3)
Sodium: 139 mEq/L (ref 137–147)
Sodium: 140 mEq/L (ref 137–147)

## 2013-08-20 LAB — GLUCOSE, CAPILLARY
GLUCOSE-CAPILLARY: 93 mg/dL (ref 70–99)
GLUCOSE-CAPILLARY: 102 mg/dL — AB (ref 70–99)
Glucose-Capillary: 100 mg/dL — ABNORMAL HIGH (ref 70–99)
Glucose-Capillary: 113 mg/dL — ABNORMAL HIGH (ref 70–99)
Glucose-Capillary: 98 mg/dL (ref 70–99)

## 2013-08-20 LAB — CLOSTRIDIUM DIFFICILE BY PCR: CDIFFPCR: NEGATIVE

## 2013-08-20 MED ORDER — HYDROCODONE-ACETAMINOPHEN 5-325 MG PO TABS
1.0000 | ORAL_TABLET | Freq: Once | ORAL | Status: AC
  Administered 2013-08-20: 1 via ORAL
  Filled 2013-08-20: qty 1

## 2013-08-20 NOTE — Clinical Social Work Psychosocial (Signed)
Clinical Social Work Department BRIEF PSYCHOSOCIAL ASSESSMENT 08/20/2013  Patient:  Brandon Harrell,Brandon Harrell     Account Number:  [id number]     Admit date:  08/12/2013  Clinical Social Worker:  Patrick-jefferson,Crystal, LCSWA  Date/Time:  08/20/2013 04:59 PM  Referred by:  Physician  Date Referred:  08/20/2013 Referred for  SNF Placement   Other Referral:   Interview type:  Family Other interview type:   CSW spoke with patient's son Velton Jr. as patient provided consent.    PSYCHOSOCIAL DATA Living Status:  WITH ADULT CHILDREN Admitted from facility:   Level of care:   Primary support name:  Jae Grondahl Jr. Primary support relationship to patient:  CHILD, ADULT Degree of support available:   Good. Patient resides with son.    CURRENT CONCERNS Current Concerns  Post-Acute Placement   Other Concerns:    SOCIAL WORK ASSESSMENT / PLAN CSW met with patient's son who was present at bedside. CSW introduced self and explained role. CSW discussed d/c plan with patient and son. Per son, patient was residing in [place] and using a cane to get around. Son reported, he has to relocate patient to [place] to live with him as patient became increasingly unable to live alone. Patient's son reported patient has been living with him for one month. Per son, preference is [facility], but he is open to expanding search to additional counties in order to place patient.   Assessment/plan status:  Psychosocial Support/Ongoing Assessment of Needs Other assessment/ plan:   Will complete FL2 for SNF placement.   Information/referral to community resources:    PATIENT'S/FAMILY'S RESPONSE TO PLAN OF CARE: Patient and son thanked CSW for assistance in d/c planning process.   Crystal Patrick-Jefferson, LCSWA Weekend Clinical Social Worker [phone]     

## 2013-08-20 NOTE — Clinical Social Work Note (Signed)
CSW informed patient is ready for D/C to SNF. CSW met with patient and son who is agreeable to SNF. CSW completed assessment,  FL2, and faxed FL2 out to community SNF. CSW provided patient and son with current bed offers. Patient and son declined bed offers: [facility] and [facility], stating would prefer to receive more bed offers located in [place] to make a more informed decision. CSW explained to patient and son, decision needs to be made regarding available bed offers. Patient's son stated he was informed by MD of d/c on Monday, 5/25 and has not had time to review facilities. CSW verbalized understanding and made MD aware.   [place], [place] Weekend Clinical Social Worker [phone]

## 2013-08-20 NOTE — Progress Notes (Signed)
Subjective: No acute events overnight.  Brandon Harrell was seen and examined this AM.  He is feeling better, tolerated bland diet yesterday.   BMs yesterday, none yet today.  Patient feels good, denies abd pain or N/V.  Eating breakfast.  Objective: Vital signs in last 24 hours: Filed Vitals:   08/19/13 0612 08/19/13 1401 08/19/13 2105 08/20/13 0501  BP: 113/69 122/72 112/64 125/81  Pulse: 84 90 94 95  Temp:  97.7 F (36.5 C) 98.5 F (36.9 C) 98.7 F (37.1 C)  TempSrc:  Oral Oral Oral  Resp:  20 20 20   Height:      Weight:      SpO2:  93% 92% 92%   Weight change:  No intake or output data in the 24 hours ending 08/20/13 0754  Blood pressure 102/55, pulse 117, temperature 97.5 F (36.4 C), temperature source Rectal, resp. rate 20, height 5\' 7"  (1.702 m), weight 130 lb (58.968 kg), SpO2 97.00%.  General: sitting up in chair in NAD, awake and alert, eating breakfast HEENT: no gross abnormality Cardiac: RRR, no rubs, murmurs or gallops  Pulm: CTA B/L, moving normal volumes of air  Abd: soft, nondistended, + BS, non-tender, no rebound/guarding Ext: warm and well perfused, no pedal edema Neuro: alert and oriented X3, responding appropriately  Lab Results: Basic Metabolic Panel:  Recent Labs Lab 08/15/13 2050 08/16/13 0800  08/19/13 1642 08/20/13 0430  NA 144 141  < > 138 139  K 3.7 3.4*  < > 3.7 3.7  CL 107 105  < > 104 104  CO2 27 25  < > 23 24  GLUCOSE 140* 112*  < > 97 98  BUN 12 11  < > 7 7  CREATININE 0.86 0.81  < > 0.85 0.92  CALCIUM 8.1* 7.5*  < > 7.7* 7.7*  MG 1.8 1.5  --   --   --   < > = values in this interval not displayed.  CBC:  Recent Labs Lab 08/19/13 1642 08/20/13 0430  WBC 5.0 5.3  NEUTROABS 2.9 2.8  HGB 9.1* 8.5*  HCT 27.8* 26.5*  MCV 81.8 82.0  PLT 214 240   CBG:  Recent Labs Lab 08/19/13 0104 08/19/13 0520 08/19/13 1200 08/19/13 1833 08/20/13 0337 08/20/13 0602  GLUCAP 71 80 112* 93 100* 93   Studies/Results: No results  found. Medications: I have reviewed the patient's current medications. Scheduled Meds: . feeding supplement (ENSURE COMPLETE)  237 mL Oral BID BM  . heparin  5,000 Units Subcutaneous 3 times per day  . lip balm   Topical BID  . sodium chloride  3 mL Intravenous Q12H   Continuous Infusions: none   PRN Meds:.alum & mag hydroxide-simeth, bisacodyl, fentaNYL, lactated ringers, magic mouthwash, ondansetron  Assessment/Plan: 80 year old male with a PMH of DM and prostate CA who presents with complaint of decreased po and episode of vomiting x 4 days.   SBO, resolved: CT - High-grade small bowel obstruction w/ transition to decompressed distal small bowel in the RLQ. Surgery following and recommendations appreciated.  NG came out, surgery ok with leaving out.  SBO seems resolved. - continue bland diet - Fentanyl for pain control, Zofran for nausea  - ambulate with PT as tolerated  Deconditioning:  PT recommend 24 hour supervisions/assistance/SNF Patient and family have decided on short-term SNF for rehab. - appreciate social worker assistance in finding placement - plan to d/c when SNF bed available (likely 05/25 at the earliest)  Loose BM:  2 loose, non-bloody BMs yesterday. Patient afebrile, no leukocytosis.  He had been on liquid diet the past two days which probably contributed to loose BMs, also sign that SBO resolving. - monitor  Hematuria, resolved:  Hematuria during admission.  Urology consulted given his metastatic prostate CA hx.  Recommendations appreciated.   - will leave current foley in place at d/c, to be replaced in 4 weeks.   - f/u to be arranged with Urology  Hypokalemia in setting of large NG outpt, resolved: K 3.7 this AM.  - monitor BMP daily  Tachycardia 2/2 dehydration, resolved:  Poc troponin negative. CTA originally ordered to r/o PE in the setting of active malignancy.  However CTA never done, patient w/o chest pain or hypoxia so will hold off on CTA for now.   -  d/c telemetry   AG metabolic acidosis resolved: AG 20>>16>>12>> 11, likely 2/2 to lactic acidosis 2.36 in the setting of SBO.  - treating SBO as above  - monitor BMP  AKI, resolved:  Unknown baseline but Cr 1.22>> 1.52>>>0.92 with hydration. - monitor BMP  UTI: Chronic foley. Patient asymptomatic. Received ceftriaxone in the ED. His son reports he has been prescribed antibiotics twice in the past month for UTI.  Most recently prescribed Levaquin for UTI and possible PNA (1 week ago).  - urine culture negative, ceftriaxone discontinued  Anemia: Hgb 12.2 >> 8.5, unknown baseline. Initial FOBT neg.  Drop thought to be dilutional, however had hematuria.  Hematuria since resolved.   - monitor CBC (twice daily) - transfuse for hgb < 8.0  Prostate Ca with mets to spine: Currently receiving Lupron and Zytiga. Urologist is in [place].  Current PSA 396.60. - continue to hold Zytiga (not on hospital formulary) - Fentanyl for pain control   Diet: bland diet VTE ppx: SCDs, heparin  Code: Full  Dispo: Disposition is deferred at this time, awaiting improvement of current medical problems.  Anticipated discharge in approximately 1-2 day(s).   The patient does not have a current PCP (No Pcp Per Patient) and does need an [facility] hospital follow-up appointment after discharge.  The patient does not know have transportation limitations that hinder transportation to clinic appointments.  .Services Needed at time of discharge: Y = Yes, Blank = No PT:   OT:   RN:   Equipment:   Other:     LOS: 8 days   Brandon Maxin, DO 08/20/2013, 7:54 AM

## 2013-08-20 NOTE — Clinical Social Work Placement (Signed)
Clinical Social Work Department CLINICAL SOCIAL WORK PLACEMENT NOTE 08/20/2013  Patient:  Brandon Harrell, Brandon Harrell  Account Number:  [id number] [place] date:  08/12/2013  Clinical Social Worker:  Carrington Clamp, LCSWA  Date/time:  08/20/2013 05:05 PM  Clinical Social Work is seeking post-discharge placement for this patient at the following level of care:   SKILLED NURSING   (*CSW will update this form in Epic as items are completed)   08/20/2013  Patient/family provided with [place] Department of Clinical Social Work's list of facilities offering this level of care within the geographic area requested by the patient (or if unable, by the patient's family).  08/20/2013  Patient/family informed of their freedom to choose among providers that offer the needed level of care, that participate in Medicare, Medicaid or managed care program needed by the patient, have an available bed and are willing to accept the patient.  08/20/2013  Patient/family informed of [facility]' ownership interest in [facility], as well as of the fact that they are under no obligation to receive care at this facility.  PASARR submitted to EDS on 08/20/2013 PASARR number received from EDS on 08/20/2013  FL2 transmitted to all facilities in geographic area requested by pt/family on  08/20/2013 FL2 transmitted to all facilities within larger geographic area on 08/20/2013  Patient informed that his/her managed care company has contracts with or will negotiate with  certain facilities, including the following:     Patient/family informed of bed offers received:  08/20/2013 Patient chooses bed at  Physician recommends and patient chooses bed at    Patient to be transferred to  on   Patient to be transferred to facility by   The following physician request were entered in Epic:   Additional Comments:  Carrington Clamp, [place] Weekend Clinical Social Worker [phone]

## 2013-08-20 NOTE — Progress Notes (Signed)
Agree with the assessment and treatment plan outlined by Ms. Wrightwood, PA   [place]. Dalbert Batman, M.D., [facility] General and Minimally invasive Surgery Breast and Colorectal Surgery Office:   [phone] Pager:   [phone]

## 2013-08-20 NOTE — Progress Notes (Signed)
Patient ID: Brandon Harrell, male   DOB: 1920/03/28, 80 y.o.   MRN: [id number]    Subjective: Pt feels well. No c/o.  Tolerating a solid diet.  +BM and flatus.  Objective: Vital signs in last 24 hours: Temp:  [97.7 F (36.5 C)-98.7 F (37.1 C)] 98.7 F (37.1 C) (05/23 0501) Pulse Rate:  [90-95] 95 (05/23 0501) Resp:  [20] 20 (05/23 0501) BP: (112-125)/(64-81) 125/81 mmHg (05/23 0501) SpO2:  [92 %-93 %] 92 % (05/23 0501) Last BM Date: 08/19/13  Intake/Output from previous day:   Intake/Output this shift:    PE: Abd: soft, NT, ND, +BS  Lab Results:   Recent Labs  08/19/13 1642 08/20/13 0430  WBC 5.0 5.3  HGB 9.1* 8.5*  HCT 27.8* 26.5*  PLT 214 240   BMET  Recent Labs  08/19/13 1642 08/20/13 0430  NA 138 139  K 3.7 3.7  CL 104 104  CO2 23 24  GLUCOSE 97 98  BUN 7 7  CREATININE 0.85 0.92  CALCIUM 7.7* 7.7*   PT/INR No results found for this basename: LABPROT, INR,  in the last 72 hours CMP     Component Value Date/Time   NA 139 08/20/2013 0430   K 3.7 08/20/2013 0430   CL 104 08/20/2013 0430   CO2 24 08/20/2013 0430   GLUCOSE 98 08/20/2013 0430   BUN 7 08/20/2013 0430   CREATININE 0.92 08/20/2013 0430   CALCIUM 7.7* 08/20/2013 0430   PROT 7.9 08/12/2013 1050   ALBUMIN 2.7* 08/12/2013 1050   AST 27 08/12/2013 1050   ALT 6 08/12/2013 1050   ALKPHOS 335* 08/12/2013 1050   BILITOT 0.4 08/12/2013 1050   GFRNONAA 71* 08/20/2013 0430   GFRAA 82* 08/20/2013 0430   Lipase  No results found for this basename: lipase       Studies/Results: No results found.  Anti-infectives: Anti-infectives   Start     Dose/Rate Route Frequency Ordered Stop   08/13/13 1400  cefTRIAXone (ROCEPHIN) 1 g in dextrose 5 % 50 mL IVPB  Status:  Discontinued     1 g 100 mL/hr over 30 Minutes Intravenous Every 24 hours 08/12/13 1903 08/16/13 1349   08/12/13 1345  cefTRIAXone (ROCEPHIN) 1 g in dextrose 5 % 50 mL IVPB     1 g 100 mL/hr over 30 Minutes Intravenous  Once 08/12/13 1345 08/12/13  1433       Assessment/Plan  1. Sbo, resolved  Plan: 1. Patient is tolerating a regular diet.  No surgical intervention needed.  Patient stable for dc home from our standpoint.  We will sign off.   LOS: 8 days    Henreitta Cea 08/20/2013, 10:09 AM Pager: [phone]

## 2013-08-21 LAB — BASIC METABOLIC PANEL
BUN: 7 mg/dL (ref 6–23)
BUN: 7 mg/dL (ref 6–23)
CO2: 25 mEq/L (ref 19–32)
CO2: 24 mEq/L (ref 19–32)
Calcium: 7.7 mg/dL — ABNORMAL LOW (ref 8.4–10.5)
Calcium: 7.6 mg/dL — ABNORMAL LOW (ref 8.4–10.5)
Chloride: 106 mEq/L (ref 96–112)
Chloride: 105 mEq/L (ref 96–112)
Creatinine, Ser: 0.87 mg/dL (ref 0.50–1.35)
Creatinine, Ser: 0.92 mg/dL (ref 0.50–1.35)
GFR calc non Af Amer: 73 mL/min — ABNORMAL LOW (ref >90)
GFR calc non Af Amer: 71 mL/min — ABNORMAL LOW (ref >90)
GFR, EST AFRICAN AMERICAN: 84 mL/min — AB (ref >90)
GFR, EST AFRICAN AMERICAN: 82 mL/min — AB (ref >90)
Glucose, Bld: 69 mg/dL — ABNORMAL LOW (ref 70–99)
Glucose, Bld: 105 mg/dL — ABNORMAL HIGH (ref 70–99)
Potassium: 3.4 mEq/L — ABNORMAL LOW (ref 3.7–5.3)
Potassium: 3.3 mEq/L — ABNORMAL LOW (ref 3.7–5.3)
Sodium: 141 mEq/L (ref 137–147)
Sodium: 139 mEq/L (ref 137–147)

## 2013-08-21 LAB — CBC WITH DIFFERENTIAL/PLATELET
BASOS ABS: 0 10*3/uL (ref 0.0–0.1)
BASOS PCT: 0 % (ref 0–1)
Basophils Absolute: 0 10*3/uL (ref 0.0–0.1)
Basophils Relative: 0 % (ref 0–1)
EOS PCT: 5 % (ref 0–5)
Eosinophils Absolute: 0.3 10*3/uL (ref 0.0–0.7)
Eosinophils Absolute: 0.2 10*3/uL (ref 0.0–0.7)
Eosinophils Relative: 5 % (ref 0–5)
HCT: 26.3 % — ABNORMAL LOW (ref 39.0–52.0)
HCT: 25.5 % — ABNORMAL LOW (ref 39.0–52.0)
HEMOGLOBIN: 7.9 g/dL — AB (ref 13.0–17.0)
Hemoglobin: 8.4 g/dL — ABNORMAL LOW (ref 13.0–17.0)
LYMPHS ABS: 1.1 10*3/uL (ref 0.7–4.0)
LYMPHS PCT: 28 % (ref 12–46)
LYMPHS PCT: 25 % (ref 12–46)
Lymphs Abs: 1.4 10*3/uL (ref 0.7–4.0)
MCH: 26.3 pg (ref 26.0–34.0)
MCH: 25.4 pg — ABNORMAL LOW (ref 26.0–34.0)
MCHC: 31.9 g/dL (ref 30.0–36.0)
MCHC: 31 g/dL (ref 30.0–36.0)
MCV: 82.4 fL (ref 78.0–100.0)
MCV: 82 fL (ref 78.0–100.0)
MONO ABS: 0.5 10*3/uL (ref 0.1–1.0)
Monocytes Absolute: 0.6 10*3/uL (ref 0.1–1.0)
Monocytes Relative: 12 % (ref 3–12)
Monocytes Relative: 10 % (ref 3–12)
NEUTROS ABS: 2.7 10*3/uL (ref 1.7–7.7)
Neutro Abs: 2.8 10*3/uL (ref 1.7–7.7)
Neutrophils Relative %: 55 % (ref 43–77)
Neutrophils Relative %: 60 % (ref 43–77)
PLATELETS: 242 10*3/uL (ref 150–400)
Platelets: 240 10*3/uL (ref 150–400)
RBC: 3.19 MIL/uL — ABNORMAL LOW (ref 4.22–5.81)
RBC: 3.11 MIL/uL — ABNORMAL LOW (ref 4.22–5.81)
RDW: 17.4 % — ABNORMAL HIGH (ref 11.5–15.5)
RDW: 17.2 % — ABNORMAL HIGH (ref 11.5–15.5)
WBC: 5.1 10*3/uL (ref 4.0–10.5)
WBC: 4.5 10*3/uL (ref 4.0–10.5)

## 2013-08-21 LAB — GLUCOSE, CAPILLARY
GLUCOSE-CAPILLARY: 64 mg/dL — AB (ref 70–99)
GLUCOSE-CAPILLARY: 74 mg/dL (ref 70–99)
Glucose-Capillary: 74 mg/dL (ref 70–99)
Glucose-Capillary: 78 mg/dL (ref 70–99)
Glucose-Capillary: 87 mg/dL (ref 70–99)
Glucose-Capillary: 66 mg/dL — ABNORMAL LOW (ref 70–99)

## 2013-08-21 MED ORDER — DEXTROSE 50 % IV SOLN
25.0000 mL | Freq: Once | INTRAVENOUS | Status: AC | PRN
  Administered 2013-08-21: 25 mL via INTRAVENOUS

## 2013-08-21 MED ORDER — DEXTROSE 50 % IV SOLN
INTRAVENOUS | Status: AC
  Administered 2013-08-21: 25 mL via INTRAVENOUS
  Filled 2013-08-21: qty 50

## 2013-08-21 MED ORDER — POTASSIUM CHLORIDE CRYS ER 20 MEQ PO TBCR
40.0000 meq | EXTENDED_RELEASE_TABLET | Freq: Once | ORAL | Status: AC
  Administered 2013-08-21: 40 meq via ORAL
  Filled 2013-08-21: qty 2

## 2013-08-21 NOTE — Progress Notes (Signed)
Hypoglycemic Event  CBG: 66  Treatment: 15 GM carbohydrate snack  Symptoms: None  Follow-up CBG: Time 2334 CBG Result: 64  Possible Reasons for Event: Inadequate meal intake  Comments/MD notified:    Arneta Cliche  Remember to initiate Hypoglycemia Order Set & complete

## 2013-08-21 NOTE — Progress Notes (Signed)
Subjective: No acute events overnight.  Mr. Brandon Harrell was seen and examined today.  He is feeling okay.  Partially eaten lunch tray at bedside.  Son says he ate most of breakfast.  He denies N/V.  No BM today but is passing gas.   Objective: Vital signs in last 24 hours: Filed Vitals:   08/20/13 1300 08/20/13 2159 08/21/13 0622 08/21/13 1323  BP: 117/64 122/67 132/66 133/75  Pulse: 85 87 81 87  Temp: 98 F (36.7 C) 98.5 F (36.9 C) 98.2 F (36.8 C) 98.5 F (36.9 C)  TempSrc: Oral Oral Oral Oral  Resp: 16 16 15 16   Height:      Weight:      SpO2: 90% 90% 93% 90%   Weight change:   Intake/Output Summary (Last 24 hours) at 08/21/13 1354 Last data filed at 08/21/13 0622  Gross per 24 hour  Intake      3 ml  Output    650 ml  Net   -647 ml    Blood pressure 102/55, pulse 117, temperature 97.5 F (36.4 C), temperature source Rectal, resp. rate 20, height 5\' 7"  (1.702 m), weight 130 lb (58.968 kg), SpO2 97.00%.  General: in bed in NAD HEENT: no gross abnormality Cardiac: RRR, no rubs, murmurs or gallops  Pulm: CTA B/L, moving normal volumes of air  Abd: soft, nondistended, + BS all four quadrants, +mild LLQ TTP, no rebound/guarding Ext: warm and well perfused, no pedal edema Neuro: alert and oriented X3, responding appropriately  Lab Results: Basic Metabolic Panel:  Recent Labs Lab 08/15/13 2050 08/16/13 0800  08/20/13 1643 08/21/13 0630  NA 144 141  < > 140 141  K 3.7 3.4*  < > 3.6* 3.4*  CL 107 105  < > 105 106  CO2 27 25  < > 25 25  GLUCOSE 140* 112*  < > 111* 69*  BUN 12 11  < > 8 7  CREATININE 0.86 0.81  < > 0.90 0.87  CALCIUM 8.1* 7.5*  < > 7.6* 7.7*  MG 1.8 1.5  --   --   --   < > = values in this interval not displayed.  CBC:  Recent Labs Lab 08/20/13 1643 08/21/13 0630  WBC 5.3 5.1  NEUTROABS 3.1 2.8  HGB 8.7* 8.4*  HCT 26.8* 26.3*  MCV 82.2 82.4  PLT 240 242   CBG:  Recent Labs Lab 08/20/13 0602 08/20/13 1218 08/20/13 1738 08/20/13 2338  08/21/13 0615 08/21/13 1159  GLUCAP 93 113* 102* 98 74 78   Studies/Results: No results found. Medications: I have reviewed the patient's current medications. Scheduled Meds: . feeding supplement (ENSURE COMPLETE)  237 mL Oral BID BM  . heparin  5,000 Units Subcutaneous 3 times per day  . lip balm   Topical BID  . sodium chloride  3 mL Intravenous Q12H   Continuous Infusions: none   PRN Meds:.alum & mag hydroxide-simeth, bisacodyl, fentaNYL, magic mouthwash, ondansetron  Assessment/Plan: 80 year old male with a PMH of DM and prostate CA who presents with complaint of decreased po and episode of vomiting x 4 days.   SBO, resolved: CT - High-grade small bowel obstruction w/ transition to decompressed distal small bowel in the RLQ. Surgery following and recommendations appreciated.  NG came out, surgery ok with leaving out.  SBO seems resolved. - continue bland diet - Fentanyl for pain control, Zofran for nausea  - ambulate with PT as tolerated  Deconditioning:  PT recommend 24 hour supervisions/assistance/SNF  Patient and family have decided on short-term SNF for rehab. - appreciate social worker assistance in finding placement - plan to d/c when SNF bed available (likely 05/25 at the earliest)  Loose BM:  2 loose, non-bloody BMs 05/22. Patient afebrile, no leukocytosis.  He had been on liquid diet the past two days which probably contributed to loose BMs, also sign that SBO resolving. - monitor  Hematuria, resolved:  Hematuria during admission.  Urology consulted given his metastatic prostate CA hx.  Recommendations appreciated.   - will leave current foley in place at d/c, to be replaced in 4 weeks.   - f/u to be arranged with Urology  Hypokalemia: K 3.4this AM.  -replete with Kdur 56mEq - monitor BMP  Tachycardia 2/2 dehydration, resolved:  Poc troponin negative. CTA originally ordered to r/o PE in the setting of active malignancy.  However CTA never done, patient w/o chest  pain or hypoxia so will hold off on CTA for now.   - d/c telemetry   AG metabolic acidosis resolved: AG 20>>16>>12>> 11, likely 2/2 to lactic acidosis 2.36 in the setting of SBO.  - treating SBO as above  - monitor BMP  AKI, resolved:  Unknown baseline but Cr 1.22>> 1.52>>>0.92 with hydration. - monitor BMP  UTI: Chronic foley. Patient asymptomatic. Received ceftriaxone in the ED. His son reports he has been prescribed antibiotics twice in the past month for UTI.  Most recently prescribed Levaquin for UTI and possible PNA (1 week ago).  - urine culture negative, ceftriaxone discontinued  Anemia: Hgb 12.2 >> 8.4, unknown baseline. Initial FOBT neg.  Drop thought to be dilutional, however had hematuria.  Hematuria since resolved.   - monitor CBC (twice daily) - transfuse for hgb < 8.0  Prostate Ca with mets to spine: Currently receiving Lupron and Zytiga. Urologist is in [employer].  Current PSA 396.60. - continue to hold Zytiga (not on hospital formulary) - Fentanyl for pain control   Diet: bland diet VTE ppx: SCDs, heparin  Code: Full  Dispo: Disposition is deferred at this time, awaiting improvement of current medical problems.  Anticipated discharge in approximately 1-2 day(s).   The patient does not have a current PCP (No Pcp Per Patient) and does need an [facility] hospital follow-up appointment after discharge.  The patient does not know have transportation limitations that hinder transportation to clinic appointments.  .Services Needed at time of discharge: Y = Yes, Blank = No PT:   OT:   RN:   Equipment:   Other:     LOS: 9 days   [name], DO 08/21/2013, 1:54 PM

## 2013-08-21 NOTE — Clinical Social Work Note (Signed)
CSW continues to follow patient. CSW made RN and patient aware of new bed offers: [facility] ([place])  and [facility] ([place]). Patient's family chose [facility], although informed [facility] is holding bed for patient. CSW contacted [facility], weekend coordinator paged. CSW unable to reach weekend coordinator. CSW made RN aware as well as pending d/c summary. CSW to continue to follow tomorrow.   [place], [place] Weekend Clinical Social Worker [phone]

## 2013-08-22 LAB — BASIC METABOLIC PANEL
BUN: 6 mg/dL (ref 6–23)
CHLORIDE: 108 meq/L (ref 96–112)
CO2: 25 meq/L (ref 19–32)
Calcium: 7.9 mg/dL — ABNORMAL LOW (ref 8.4–10.5)
Creatinine, Ser: 0.86 mg/dL (ref 0.50–1.35)
GFR calc Af Amer: 85 mL/min — ABNORMAL LOW (ref >90)
GFR calc non Af Amer: 73 mL/min — ABNORMAL LOW (ref >90)
GLUCOSE: 85 mg/dL (ref 70–99)
POTASSIUM: 3.9 meq/L (ref 3.7–5.3)
SODIUM: 140 meq/L (ref 137–147)

## 2013-08-22 LAB — GLUCOSE, CAPILLARY
GLUCOSE-CAPILLARY: 88 mg/dL (ref 70–99)
GLUCOSE-CAPILLARY: 115 mg/dL — AB (ref 70–99)
Glucose-Capillary: 130 mg/dL — ABNORMAL HIGH (ref 70–99)

## 2013-08-22 LAB — CBC WITH DIFFERENTIAL/PLATELET
Basophils Absolute: 0 10*3/uL (ref 0.0–0.1)
Basophils Relative: 1 % (ref 0–1)
EOS PCT: 5 % (ref 0–5)
Eosinophils Absolute: 0.2 10*3/uL (ref 0.0–0.7)
HCT: 27.3 % — ABNORMAL LOW (ref 39.0–52.0)
HEMOGLOBIN: 8.5 g/dL — AB (ref 13.0–17.0)
LYMPHS ABS: 1.4 10*3/uL (ref 0.7–4.0)
Lymphocytes Relative: 31 % (ref 12–46)
MCH: 25.9 pg — ABNORMAL LOW (ref 26.0–34.0)
MCHC: 31.1 g/dL (ref 30.0–36.0)
MCV: 83.2 fL (ref 78.0–100.0)
MONOS PCT: 10 % (ref 3–12)
Monocytes Absolute: 0.4 10*3/uL (ref 0.1–1.0)
Neutro Abs: 2.3 10*3/uL (ref 1.7–7.7)
Neutrophils Relative %: 53 % (ref 43–77)
Platelets: 262 10*3/uL (ref 150–400)
RBC: 3.28 MIL/uL — AB (ref 4.22–5.81)
RDW: 17.3 % — ABNORMAL HIGH (ref 11.5–15.5)
WBC: 4.4 10*3/uL (ref 4.0–10.5)

## 2013-08-22 MED ORDER — BOOST PO LIQD: 237.0000 mL | Freq: Two times a day (BID) | ORAL | Status: DC

## 2013-08-22 MED ORDER — HYDROCODONE-ACETAMINOPHEN 5-325 MG PO TABS: 1.0000 | ORAL_TABLET | Freq: Two times a day (BID) | ORAL | Status: DC | PRN

## 2013-08-22 NOTE — Discharge Instructions (Addendum)
1. Someone from Dr. Carlton Adam office [facility]) will contact you to arrange a follow-up in the office regarding your prostate cancer.  If you do not hear from the office in the next week please call them at [phone].  A doctor will monitor your care at the skilled nursing facility.  I encourage you to find a primary care doctor to follow-up with after you are discharged from the skilled nursing facility.  I have listed our clinic info below should you choose to follow-up in our clinic.      [facility]      [name], DO      [address] [place] [address]      [phone]        2. Please take all medications as prescribed.  Stop taking Starlix and Ativan.  You can continue to take Norco twice daily (at least 6 hours between each pill) if you need it for pain.  The Urologist will decide when to resume your Lupron shots.  3. Your catheter will need to be changed in 3 weeks.  The nursing facility can call the Urologist if there are problems with the catheter.  4. If you have worsening of your symptoms or new symptoms arise, please call the clinic ([phone]), or go to the ER immediately if symptoms are severe.

## 2013-08-22 NOTE — Discharge Summary (Signed)
Patient Name:  Brandon Harrell  MRN: [id number]  PCP: No PCP Per Patient  DOB:  1944/12/28       Date of Admission:  08/12/2013  Date of Discharge:  08/22/2013      Attending Physician: Dr. Annia Belt, MD         DISCHARGE DIAGNOSES: 1.   Small bowel obstruction 2.   Deconditioning 3.   Anemia  4.   Hematuria 5.   Metastatic prostate cancer 6.   Urinary tract infection 7.   Hypokalemia  8.   Anion gap metabolic acidosis 9.   Acute kidney injury 10.   Diabetes mellitus type 2 11.   Tachycardia  DISPOSITION AND FOLLOW-UP: [name] is to follow-up with the listed providers as detailed below, at patient's visiting, please address following issues:   Follow-up Information   Follow up with Ardis Hughs, MD. (office will call you with appt)    Specialty:  Urology   Contact information:   [place] [id number] [phone]       Follow up with [name], DO. (As needed, for hospital follow-up)    Specialty:  Internal Medicine   Contact information:   [place] [address] [phone]      Discharge Instructions   Call MD for:  difficulty breathing, headache or visual disturbances    Complete by:  As directed      Call MD for:  extreme fatigue    Complete by:  As directed      Call MD for:  hives    Complete by:  As directed      Call MD for:  persistant dizziness or light-headedness    Complete by:  As directed      Call MD for:  persistant nausea and vomiting    Complete by:  As directed      Call MD for:  severe uncontrolled pain    Complete by:  As directed      Call MD for:  temperature >100.4    Complete by:  As directed      Diet - low sodium heart healthy    Complete by:  As directed      Increase activity slowly    Complete by:  As directed             DISCHARGE MEDICATIONS:   Medication List    STOP taking these medications       levofloxacin 750 MG tablet  Commonly known as:  LEVAQUIN     LORazepam 0.5 MG tablet  Commonly known as:  ATIVAN     nateglinide 60 MG tablet  Commonly known as:  STARLIX      TAKE these medications       HYDROcodone-acetaminophen 5-325 MG per tablet  Commonly known as:  NORCO/VICODIN  Take 1 tablet by mouth 2 (two) times daily as needed for moderate pain. Give at least 6 hours apart.     lactose free nutrition Liqd  Take 237 mLs by mouth 2 (two) times daily between meals.     ZYTIGA 250 MG tablet  Generic drug:  abiraterone Acetate  Take 1,000 mg by mouth daily. Take on an empty stomach 1 hour before or 2 hours after a meal         CONSULTS:  General Surgery Urology   PROCEDURES PERFORMED:  X-ray Chest Pa And Lateral   08/13/2013   CLINICAL DATA:  Infiltrates.  EXAM: CHEST  2 VIEW  COMPARISON:  Aug 12, 2013  FINDINGS: The heart size and mediastinal contours are stable. The aorta is tortuous. There is atelectasis of bilateral lung bases not changed compared to prior exam. Previously questioned nodularity of the right mid lung is unchanged. There are small posterior pleural effusions. Nasogastric tube is noted distal tip in the stomach. The visualized skeletal structures are stable.  IMPRESSION: Atelectasis of bilateral lung bases not significantly changed compared to prior exam. Small bilateral posterior pleural effusions.   Electronically Signed   By: Abelardo Diesel M.D.   On: 08/13/2013 18:51   Dg Chest 2 View  08/12/2013   CLINICAL DATA:  Emesis and fatigue.  EXAM: CHEST  2 VIEW  COMPARISON:  None.  FINDINGS: Evaluation of the mediastinum is mildly limited by patient rotation. The cardiac silhouette does not appear enlarged. Thoracic aortic calcification is noted. The lungs are hypoinflated with predominantly linear opacities in the lung bases. A few small nodular opacities are present in both lungs. The most prominent rounded opacity is in the right lung base and could represent nipple shadow. There is blunting of the right costophrenic  angle, and a small right pleural effusion is not excluded. No pneumothorax is identified.  There is a large air-fluid level under the diaphragm in the left upper quadrant. There is also mild gaseous distension of multiple bowel loops in the upper abdomen, incompletely imaged. Aortic stent graft is partially visualized. No acute osseous abnormality is identified.  IMPRESSION: 1. Hypoinflation with bibasilar lung opacities, most likely reflecting subsegmental atelectasis although developing infection is not excluded. 2. Scattered, nodular lung opacities. Follow-up formal upright PA and lateral radiographs are suggested when the patient's acute illness has improved and he is able to take a larger inspiration, with consideration for subsequent chest CT. 3. Large air-fluid level in the left upper quadrant, favored to reflect distended stomach, with adjacent mildly distended bowel loops. Dedicated abdominal radiographs are recommended.   Electronically Signed   By: Logan Bores   On: 08/12/2013 11:30   Ct Abdomen Pelvis W Contrast  08/12/2013   CLINICAL DATA:  Dilated bowel loops and vomiting. Fatigue. Rule out small bowel obstruction.  EXAM: CT ABDOMEN AND PELVIS WITH CONTRAST  TECHNIQUE: Multidetector CT imaging of the abdomen and pelvis was performed using the standard protocol following bolus administration of intravenous contrast.  CONTRAST:  50m OMNIPAQUE IOHEXOL 300 MG/ML  SOLN  COMPARISON:  None.  FINDINGS: Mild subpleural ground-glass and reticular opacities are present in the lung bases, which may reflect atelectasis although a component of interstitial lung disease is also a consideration. Calcified pleural plaques are partially visualized in the posterior lungs bilaterally. There is no pleural effusion. Three-vessel coronary artery calcifications are present.  Enhancement of the liver is mildly heterogeneous without focal abnormality identified, possibly due to contrast timing. The spleen is also minimally  heterogeneous in attenuation, also likely related to contrast timing. The gallbladder, adrenal glands, and right kidney are unremarkable. 2 cm left upper pole renal cyst is noted. Additional subcentimeter left renal hypodensities also likely represent cysts but are too small to fully characterize. The pancreas is diffusely atrophic.  There is marked distention of the stomach with a large air-fluid level present. The duodenum does not cross the midline, however this may be due to displacement by the large abdominal aortic aneurysm. The majority of the small bowel is fluid-filled and dilated, measuring up to 5 cm in diameter. There is a transition  to decompressed distal small bowel in the right lower quadrant, although the precise site of transition is not clearly identified. The colon contains a small amount of stool and is largely decompressed. There is a small amount of intraperitoneal free fluid.  No intraperitoneal free air is identified. A Foley catheter is in place. The prostate is diffusely enlarged and heterogeneous with areas of nodular enhancement present. There is been prior endovascular repair of an abdominal aortic aneurysm with an aorto bi-iliac stent graft. The graft is patent. The native aneurysm sac measures 6.6 x 5.9 cm. The native right common iliac artery aneurysm sac measures 4.2 cm in diameter. There has been prior right internal iliac artery embolization. 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 Sclerotic lesions are present diffusely throughout the visualized ribs, spine, and pelvis.  IMPRESSION: 1. Evidence of high-grade small bowel obstruction with transition to decompressed distal small bowel in the right lower quadrant. 2. Small amount of intraperitoneal free fluid. 3. Enlarged, heterogeneous prostate with diffuse sclerotic bone lesions, consistent with metastatic prostate  cancer. 4. Partially calcified pleural plaques, suggestive of prior asbestos exposure. These may be the etiology for the nodular opacities seen on recent chest radiographs. 5. 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   Electronically Signed   By: Logan Bores   On: 08/12/2013 13:31   Dg Abd 2 Views  08/16/2013   CLINICAL DATA:  80 year old male with small bowel obstruction. Initial encounter.  EXAM: ABDOMEN - 2 VIEW  COMPARISON:  08/15/2013 and earlier.  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  No pneumoperitoneum.  Continued gas-filled small bowel loops with small bowel fluid levels. Individual loops measuring up to 50 mm diameter. There is some colonic gas present as before. Stable visualized osseous structures.  IMPRESSION: 1. No free air. 2. Continued partial small bowel obstruction.   Electronically Signed   By: Lars Pinks M.D.   On: 08/16/2013 11:37   Dg Abd 2 Views  08/15/2013   CLINICAL DATA:  Small bowel obstruction.  EXAM: ABDOMEN - 2 VIEW  COMPARISON:  Abdominal series 5/16 /2015.  CT 08/12/2013.  FINDINGS: Previously identified small bowel distention again noted, this has improved however. Stool is noted within the colon. No free air is noted. Aortoiliac stent graft noted. Coils node in the right lower pelvis. Bony structures about the abdomen and pelvis are diffusely sclerotic consistent blastic metastatic disease.  IMPRESSION: Interim partial improvement in small bowel distention.   Electronically Signed   By: Marcello Moores  Register   On: 08/15/2013 09:07   Dg Abd 2 Views  08/13/2013   CLINICAL DATA:  Small bowel obstruction  EXAM: ABDOMEN - 2 VIEW  COMPARISON:  CT ABD/PELVIS W CM dated 08/12/2013  FINDINGS: Aortic stent graft reidentified. Increase in prominence of numerous dilated small bowel loops is noted, particularly over the right mid abdomen. No free air. Nasogastric tube  terminates over the expected location of the stomach. Coils project over the pelvis.  IMPRESSION: Increased dilatation of predominantly right-sided dilated small bowel loops with persistent overall pattern of small bowel obstruction. This does carry a risk of bowel ischemia.   Electronically Signed   By: Conchita Paris M.D.   On: 08/13/2013 19:05       ADMISSION DATA: HPI: Mr. Clapper is 80 year old male with  a PMH of DM and met prostate CA (on Lupron and Zytiga) who presents with complaint decreased po and episode of dark brown vomiting. History is provided by the patient and his son. Has not eaten in about 4 days. 1 episode of dark brown vomiting last night. His son says he did not complain of abdominal pain. He denies constipation/diarrhea and says his last BM was yesterday. He denies F/C, chest pain, dyspnea or dysuria.  In the ED: T 97.73F, HR 25, SpO2 100%, HR 100-110s, BP 113/2mHg; ceftriaxone and NS given; surgery consulted - NG placed.  Physical Exam: Blood pressure 102/55, pulse 117, temperature 97.5 F (36.4 C), temperature source Rectal, resp. rate 20, height _0  (1.702 m), weight 130 lb (58.968 kg), SpO2 97.00%.  General: resting in bed in NAD  HEENT: left pupil < right, round and reactive to light, EOMI, oropharynx dry, NG tube in place with red/brownish drainage  Cardiac: RRR, no rubs, murmurs or gallops  Pulm: clear to auscultation bilaterally, moving normal volumes of air  Abd: soft, nondistended, hyperactive BS LUQ and LLQ, decreased BS RUQ and RLQ  Ext: warm and well perfused, no pedal edema, 2+ DPs  Neuro: alert and oriented X3, responding appropriately, cranial nerves II-XII grossly intact, muscle strength equal and intact  Labs: Basic Metabolic Panel:   Recent Labs   08/12/13 1050   NA  141   K  3.6*   CL  99   CO2  22   GLUCOSE  140*   BUN  20   CREATININE  1.22   CALCIUM  9.2    Liver Function Tests:   Recent Labs   08/12/13 1050   AST  27   ALT  6     ALKPHOS  335*   BILITOT  0.4   PROT  7.9   ALBUMIN  2.7*    CBC:   Recent Labs   08/12/13 1050   WBC  6.0   NEUTROABS  4.5   HGB  12.2*   HCT  38.1*   MCV  82.1   PLT  405*    Coagulation:   Recent Labs   08/12/13 1050   LABPROT  15.3*   INR  1.24    Urinalysis:   Recent Labs   08/12/13 1125   COLORURINE  AMBER*   LABSPEC  1.026   PHURINE  5.0   GLUCOSEU  NEGATIVE   HGBUR  LARGE*   BILIRUBINUR  SMALL*   KETONESUR  15*   PROTEINUR  100*   UROBILINOGEN  1.0   NITRITE  NEGATIVE   LEUKOCYTESUR  MODERATE*     HOSPITAL COURSE: Small bowel obstruction:  CT scan revealed high-grade small bowel obstruction with transition to decompressed distal small bowel in the RLQ. General surgery was consulted and recommended trial of conservative management.  An NG tube was placed for decompression, the patient was made npo and started on IV fluids.  His pain and nausea were controlled with IV medications.  He was also encouraged to ambulate with PT to aid in resolution of his SBO.  As his symptoms improved, his diet was advanced as tolerated.  Serial abdominal xrays were obtained to monitor progression.  The patient remained stable without evidence of peritonitis or shock and there was no need for surgical intervention as conservative management seemed to be working.  He began to have bowel movements and continued to improve and was felt stable for discharge.  Given his deconditioning PT recommended  24 hour supervision/skilled nursing facility placement.  The patient and family were agreeable to skilled nursing facility placement for short-term rehab.  The patient is tolerating a bland diet and should advance slowly as tolerate.  He was discharged in stable condition to skilled nursing facility.  He should follow-up with Urology regarding metastatic prostate cancer and hematuria he experienced during admission.  The patient is new to [facility] and does not have a PCP.  I have encouraged  patient and his family to select a PCP to follow-up with after discharge.  He has been provided with contact info for the [facility] should he desire to follow with [place].  Deconditioning:  PT recommend 24 hour supervisions/assistance/SNF Patient and family have decided on short-term SNF for rehab.   Anemia:   He has unknown baseline.  His Hgb was 12.2 but fell to 8-9 early in admission.  His FOBT was negative, MCV normal.  The initial drop thought to be dilutional, however he had hematuria.  Hematuria has since resolved and hgb stable in the 8s.  He has not required blood transfusion.  He will need CBC monitoring at SNF and after discharge.  The patient is to follow-up with Urology after discharge.    Hematuria, resolved:  He experienced hematuria during admission. Urology was consulted given his metastatic prostate cancer.  The urologist recommends leaving current foley in place at discharge and replacing it after four weeks (on or near 09/12/2013).  Urologist, Dr. Louis Meckel (with [facility]) will arrange follow-up in their office.  The patient has also been provided with contact information should he need to follow-up sooner.  Would recommend that SNF contact Dr. Louis Meckel at [facility] if there are problems with the catheter.   Prostate Ca with mets to spine:  The patient is currently receiving Lupron and Zytiga. His Urologist is in [place] but he has recently moved to live with his son in [place].  Lupron was held during admission since this is usually administerd every month to every 6 months and should be re-initiated by Urology.  Fabio Asa was held because it was not on hospital formulary.  Zytiga can be resumed at discharge.  He can resume Norco BID prn at discharge for pain control.  He has required very little pain medication during admission (often going many days in a row without a prn).  Follow-up will be arranged by Urologist.  The  office will contact him with an appointment date.  The patient and family have also been provided with [facility] address and phone number should he need to be seen earlier.    Urinary tract infection:  The patient wears a chronic foley. He had a positive urinalysis and was started on ceftriaxone.  He received four days of ceftriaxone.  Urine culture was negative so ceftriaxone was discontinued.   Hypokalemia, resolved:  His low potassium was likely related to increased NG output.  He was supplemented with potassium as needed.  Would recommend monitoring his BMP at SNF.  AG metabolic acidosis, resolved:  He initially had an anion gap of 20 likely due to elevated lactic acid in the setting of SBO.  His anion gap normalized as he recovered from SBO.  AKI, resolved:  He has no know history of CKD.  His creatinine was 1.22, likely pre-renal,  and normalized with hydration.  DM type 2:  His home medication is nateglinide (Starlix) 47m TID. This was held during admission. His BG has been stable without it.  CBG 66 on the night prior to discharge but otherwise BGs mostly 70s-110s during admission.  Starlix was discontinued at discharge given normal blood glucose without it during admission and its risk of hypoglycemia, particularly in the elderly.  He will need continued CBG monitoring at SNF and may require DM treatment if CBGs are no longer well controlled.  Would recommend follow up with a PCP to check BG after discharge from SNF.  Tachycardia 2/2 dehydration, resolved:  His troponin was negative, no ischemic changes on EKG. He had no chest pain or hypoxia to suggest PE.  His tachycardia was likely related to dehydration and it improved with fluids.   DISCHARGE DATA: Vital Signs: BP 137/76  Pulse 81  Temp(Src) 98.3 F (36.8 C) (Oral)  Resp 16  Ht _0  (1.702 m)  Wt 59.058 kg (130 lb 3.2 oz)  BMI 20.39 kg/m2  SpO2 93%  Labs: Results for orders placed during the hospital encounter of  08/12/13 (from the past 24 hour(s))  BASIC METABOLIC PANEL     Status: Abnormal   Collection Time    08/21/13  5:07 PM      Result Value Ref Range   Sodium 139  137 - 147 mEq/L   Potassium 3.3 (*) 3.7 - 5.3 mEq/L   Chloride 105  96 - 112 mEq/L   CO2 24  19 - 32 mEq/L   Glucose, Bld 105 (*) 70 - 99 mg/dL   BUN 7  6 - 23 mg/dL   Creatinine, Ser 0.92  0.50 - 1.35 mg/dL   Calcium 7.6 (*) 8.4 - 10.5 mg/dL   GFR calc non Af Amer 71 (*) >90 mL/min   GFR calc Af Amer 82 (*) >90 mL/min  CBC WITH DIFFERENTIAL     Status: Abnormal   Collection Time    08/21/13  5:07 PM      Result Value Ref Range   WBC 4.5  4.0 - 10.5 K/uL   RBC 3.11 (*) 4.22 - 5.81 MIL/uL   Hemoglobin 7.9 (*) 13.0 - 17.0 g/dL   HCT 25.5 (*) 39.0 - 52.0 %   MCV 82.0  78.0 - 100.0 fL   MCH 25.4 (*) 26.0 - 34.0 pg   MCHC 31.0  30.0 - 36.0 g/dL   RDW 17.2 (*) 11.5 - 15.5 %   Platelets 240  150 - 400 K/uL   Neutrophils Relative % 60  43 - 77 %   Neutro Abs 2.7  1.7 - 7.7 K/uL   Lymphocytes Relative 25  12 - 46 %   Lymphs Abs 1.1  0.7 - 4.0 K/uL   Monocytes Relative 10  3 - 12 %   Monocytes Absolute 0.5  0.1 - 1.0 K/uL   Eosinophils Relative 5  0 - 5 %   Eosinophils Absolute 0.2  0.0 - 0.7 K/uL   Basophils Relative 0  0 - 1 %   Basophils Absolute 0.0  0.0 - 0.1 K/uL  GLUCOSE, CAPILLARY     Status: None   Collection Time    08/21/13  6:28 PM      Result Value Ref Range   Glucose-Capillary 87  70 - 99 mg/dL  GLUCOSE, CAPILLARY     Status: None   Collection Time    08/21/13  9:35 PM      Result Value Ref Range   Glucose-Capillary 74  70 - 99 mg/dL  GLUCOSE, CAPILLARY     Status: Abnormal   Collection Time  08/21/13 11:06 PM      Result Value Ref Range   Glucose-Capillary 66 (*) 70 - 99 mg/dL   Comment 1 Notify RN     Comment 2 Documented in Chart    GLUCOSE, CAPILLARY     Status: Abnormal   Collection Time    08/21/13 11:34 PM      Result Value Ref Range   Glucose-Capillary 64 (*) 70 - 99 mg/dL  GLUCOSE,  CAPILLARY     Status: Abnormal   Collection Time    08/22/13 12:14 AM      Result Value Ref Range   Glucose-Capillary 130 (*) 70 - 99 mg/dL  BASIC METABOLIC PANEL     Status: Abnormal   Collection Time    08/22/13  4:30 AM      Result Value Ref Range   Sodium 140  137 - 147 mEq/L   Potassium 3.9  3.7 - 5.3 mEq/L   Chloride 108  96 - 112 mEq/L   CO2 25  19 - 32 mEq/L   Glucose, Bld 85  70 - 99 mg/dL   BUN 6  6 - 23 mg/dL   Creatinine, Ser 0.86  0.50 - 1.35 mg/dL   Calcium 7.9 (*) 8.4 - 10.5 mg/dL   GFR calc non Af Amer 73 (*) >90 mL/min   GFR calc Af Amer 85 (*) >90 mL/min  CBC WITH DIFFERENTIAL     Status: Abnormal   Collection Time    08/22/13  4:30 AM      Result Value Ref Range   WBC 4.4  4.0 - 10.5 K/uL   RBC 3.28 (*) 4.22 - 5.81 MIL/uL   Hemoglobin 8.5 (*) 13.0 - 17.0 g/dL   HCT 27.3 (*) 39.0 - 52.0 %   MCV 83.2  78.0 - 100.0 fL   MCH 25.9 (*) 26.0 - 34.0 pg   MCHC 31.1  30.0 - 36.0 g/dL   RDW 17.3 (*) 11.5 - 15.5 %   Platelets 262  150 - 400 K/uL   Neutrophils Relative % 53  43 - 77 %   Neutro Abs 2.3  1.7 - 7.7 K/uL   Lymphocytes Relative 31  12 - 46 %   Lymphs Abs 1.4  0.7 - 4.0 K/uL   Monocytes Relative 10  3 - 12 %   Monocytes Absolute 0.4  0.1 - 1.0 K/uL   Eosinophils Relative 5  0 - 5 %   Eosinophils Absolute 0.2  0.0 - 0.7 K/uL   Basophils Relative 1  0 - 1 %   Basophils Absolute 0.0  0.0 - 0.1 K/uL  GLUCOSE, CAPILLARY     Status: None   Collection Time    08/22/13  5:16 AM      Result Value Ref Range   Glucose-Capillary 88  70 - 99 mg/dL  GLUCOSE, CAPILLARY     Status: Abnormal   Collection Time    08/22/13 11:18 AM      Result Value Ref Range   Glucose-Capillary 115 (*) 70 - 99 mg/dL   Comment 1 Notify RN       Services Ordered on Discharge: Y = Yes; Blank = No PT:   OT:   RN:   Equipment:   Other:      Time Spent on Discharge: 35 min   Signed: [name] PGY 1, Internal Medicine Resident 08/22/2013, 12:41 PM

## 2013-08-22 NOTE — Progress Notes (Signed)
Subjective: CBG 66 around 11PM last night.  Did not improve with 15gm carb snack.  Improved to 130 after D50.  Brandon Harrell was seen and examined today.  He is feeling well.  Ate breakfast this AM.  Had loose BM this AM after drinking Ensure. Otherwise no complaints.  He denies N/V or abd pain.  Objective: Vital signs in last 24 hours: Filed Vitals:   08/21/13 1323 08/21/13 2315 08/21/13 2337 08/22/13 0523  BP: 133/75 134/77  137/76  Pulse: 87 86  81  Temp: 98.5 F (36.9 C) 98.8 F (37.1 C)  98.3 F (36.8 C)  TempSrc: Oral Oral  Oral  Resp: 16 14  16   Height:      Weight:      SpO2: 90% 87% 90% 93%   Weight change:   Intake/Output Summary (Last 24 hours) at 08/22/13 0819 Last data filed at 08/22/13 0530  Gross per 24 hour  Intake      3 ml  Output    500 ml  Net   -497 ml    Blood pressure 102/55, pulse 117, temperature 97.5 F (36.4 C), temperature source Rectal, resp. rate 20, height 5\' 7"  (1.702 m), weight 130 lb (58.968 kg), SpO2 97.00%.  General: sitting up in chair HEENT: no gross abnormality Cardiac: RRR, no rubs, murmurs or gallops  Pulm: CTA B/L, moving normal volumes of air  Abd: soft, +BS, nondistended, nontender, no rebound/guarding Neuro: alert and oriented X3, responding appropriately  Lab Results: Basic Metabolic Panel:  Recent Labs Lab 08/15/13 2050 08/16/13 0800  08/21/13 1707 08/22/13 0430  NA 144 141  < > 139 140  K 3.7 3.4*  < > 3.3* 3.9  CL 107 105  < > 105 108  CO2 27 25  < > 24 25  GLUCOSE 140* 112*  < > 105* 85  BUN 12 11  < > 7 6  CREATININE 0.86 0.81  < > 0.92 0.86  CALCIUM 8.1* 7.5*  < > 7.6* 7.9*  MG 1.8 1.5  --   --   --   < > = values in this interval not displayed.  CBC:  Recent Labs Lab 08/21/13 1707 08/22/13 0430  WBC 4.5 4.4  NEUTROABS 2.7 2.3  HGB 7.9* 8.5*  HCT 25.5* 27.3*  MCV 82.0 83.2  PLT 240 262   CBG:  Recent Labs Lab 08/21/13 1828 08/21/13 2135 08/21/13 2306 08/21/13 2334 08/22/13 0014 08/22/13 0516   GLUCAP 87 74 66* 64* 130* 88   Studies/Results: No results found. Medications: I have reviewed the patient's current medications. Scheduled Meds: . feeding supplement (ENSURE COMPLETE)  237 mL Oral BID BM  . heparin  5,000 Units Subcutaneous 3 times per day  . lip balm   Topical BID  . sodium chloride  3 mL Intravenous Q12H   Continuous Infusions: none   PRN Meds:.alum & mag hydroxide-simeth, bisacodyl, fentaNYL, magic mouthwash, ondansetron  Assessment/Plan: 80 year old male with a PMH of DM and prostate CA who presents with complaint of decreased po and episode of vomiting x 4 days.   SBO, resolved: CT - High-grade small bowel obstruction w/ transition to decompressed distal small bowel in the RLQ. Surgery following and recommendations appreciated.  SBO seems resolved with conservative management. - patient is stable for d/c to SNF for short-term rehab - continue bland diet and advance as tolerated  Deconditioning:  PT recommend 24 hour supervisions/assistance/SNF Patient and family have decided on short-term SNF for rehab. -  appreciate social worker assistance in finding placement - plan to d/c to SNF today  Anemia: Hgb 12.2 >> 8.5, unknown baseline. Initial FOBT neg.  Drop thought to be dilutional, however had hematuria.  Hematuria since resolved.   - monitor CBC at SNF and after discharge, patient to follow-up with Urology - transfuse for hgb < 8.0  Loose BM:  2 loose, non-bloody BMs 05/22. Patient afebrile, no leukocytosis, cdiff negative.  He had been on liquid diet the past two days which probably contributed to loose BMs, also sign that SBO resolving.  Patient with 1 loose stool after Ensure supplement today.  He had been refusing it in previous days and likely has an intolerance to something in the formulation. - d/c Ensure; recommend Boost since the patient has taken and tolerated it in the past - monitor  Hematuria, resolved:  Hematuria during admission.  Urology  consulted given his metastatic prostate CA hx.  Recommendations appreciated.   - will leave current foley in place at d/c, to be replaced in 4 weeks.   - f/u to be arranged with Urology - Dr. Louis Meckel [facility])  Hypokalemia: K 3.4this AM.  -replete with Kdur 58mEq -monitor BMP at SNF  Tachycardia 2/2 dehydration, resolved:  Poc troponin negative. CTA originally ordered to r/o PE in the setting of active malignancy.  However CTA never done, patient w/o chest pain or hypoxia so will hold off on CTA.  - d/c telemetry   AG metabolic acidosis, resolved: AG 20>>16>>12>> 7, likely 2/2 to lactic acidosis 2.36 in the setting of SBO.  - treating SBO as above  - monitor BMP  AKI, resolved:  Unknown baseline but Cr 1.22>> 1.52>>>0.86 with hydration. - monitor BMP  UTI: Chronic foley. Patient asymptomatic. Received ceftriaxone in the ED. His son reports he has been prescribed antibiotics twice in the past month for UTI.  Most recently prescribed Levaquin for UTI and possible PNA (1 week ago).  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 Urologist is in [place].  Current PSA 396.60.  Lupron and Zytiga (not on hospital formulary) held during admission. - resume Zytiga at d/c; Lupron is usually given qmonth-q71months and should be re-initiated by Urology - can resume Norco prn at d/c for pain control - he has required very little pain medications during admission - patient to follow-up with Urology after d/c  DM type 2:  Home medication is nateglinide (Starlix) 60mg  TID.  Held during admission.  His BG has been stable without it.  CBG 66 last night but otherwise BGs mostly 70s-110s during admission. - hold Starlix  - d/c Starlix at discharge given normal BG w/o during admission and risk of hypoglycemia, particularly in the elderly - will need continued CBG monitoring at SNF and may require resumption of  DM treatment if CBGs are no longer well controlled - would recommend follow up with a PCP to check BG after discharge from SNF  Diet: bland diet VTE ppx: SCDs, heparin  Code: Full  Dispo: The patient is stable for d/c to SNF.  He should follow-up with Dr. Louis Meckel at [facility].  The office will contact him with an appointment date.  He will also be provided with [place] address and phone number should he need to be seen earlier.  The patient does not have a PCP since he is new to [facility], however his main medical issue is Prostate CA which would be  managed by Urology.  He does have DM and I would encourage him and his family to select a PCP in the area to follow-up with post SNF discharge.   The patient does not have a current PCP (No Pcp Per Patient) and does need an [facility] hospital follow-up appointment after discharge.  The patient does not know have transportation limitations that hinder transportation to clinic appointments.  .Services Needed at time of discharge: Y = Yes, Blank = No PT:   OT:   RN:   Equipment:   Other:     LOS: 10 days   Brandon Maxin, DO 08/22/2013, 8:19 AM

## 2013-08-22 NOTE — Progress Notes (Signed)
Hypoglycemic Event  CBG: 64  Treatment: D50 IV 25 mL  Symptoms: None  Follow-up CBG: Time:0014 CBG Result:130  Possible Reasons for Event: Inadequate meal intake  Comments/MD notified:    Arneta Cliche  Remember to initiate Hypoglycemia Order Set & complete

## 2013-08-22 NOTE — Progress Notes (Signed)
Nsg Discharge Note  Admit Date:  08/12/2013 Discharge date: 08/22/2013   Brandon Harrell to be D/C'd Nursing Home per MD order.  AVS completed.  Copy for chart, and copy for patient signed, and dated. Patient/caregiver able to verbalize understanding.  Discharge Medication:   Medication List    STOP taking these medications       levofloxacin 750 MG tablet  Commonly known as:  LEVAQUIN     LORazepam 0.5 MG tablet  Commonly known as:  ATIVAN     nateglinide 60 MG tablet  Commonly known as:  STARLIX      TAKE these medications       HYDROcodone-acetaminophen 5-325 MG per tablet  Commonly known as:  NORCO/VICODIN  Take 1 tablet by mouth 2 (two) times daily as needed for moderate pain. Give at least 6 hours apart.     lactose free nutrition Liqd  Take 237 mLs by mouth 2 (two) times daily between meals.     ZYTIGA 250 MG tablet  Generic drug:  abiraterone Acetate  Take 1,000 mg by mouth daily. Take on an empty stomach 1 hour before or 2 hours after a meal        Discharge Assessment: Filed Vitals:   08/22/13 0523  BP: 137/76  Pulse: 81  Temp: 98.3 F (36.8 C)  Resp: 16   Skin clean, dry and intact without evidence of skin break down, no evidence of skin tears noted. IV catheter discontinued intact. Site without signs and symptoms of complications - no redness or edema noted at insertion site, patient denies c/o pain - only slight tenderness at site.  Dressing with slight pressure applied.  D/c Instructions-Education: Discharge instructions given to patient/family with verbalized understanding. D/c education completed with patient/family including follow up instructions, medication list, d/c activities limitations if indicated, with other d/c instructions as indicated by MD - patient able to verbalize understanding, all questions fully answered. Patient instructed to return to ED, call 911, or call MD for any changes in condition.  Patient escorted via [place], and D/C Nursing  home via private auto. Report called into nursing home to [facility], RN 08/22/2013 2:38 PM

## 2013-08-22 NOTE — Clinical Social Work Note (Signed)
Per MD patient ready for DC to [facility]. Rn, patient, patient's son, and facility aware of DC. RN given number for report. Patient's son states that family will transport patient to facility. DC packet on chart for RN to give to patient's family with instruction to give to facility. CSW signing off at this time.   Liz Beach MSW, [place], [place], [phone]

## 2013-08-24 NOTE — Discharge Summary (Signed)
Attending Physician: I attest to the accuracy of the above report by resident physiican Dr. [name].

## 2013-08-29 ENCOUNTER — Other Ambulatory Visit (HOSPITAL_COMMUNITY): Payer: Self-pay | Admitting: Nurse Practitioner

## 2013-08-29 ENCOUNTER — Encounter (HOSPITAL_COMMUNITY)
Admission: RE | Admit: 2013-08-29 | Discharge: 2013-08-29 | Disposition: A | Discharge status: Home / Self Care | Payer: Medicare Other | Source: Ambulatory Visit | Attending: Internal Medicine | Admitting: Internal Medicine

## 2013-08-29 ENCOUNTER — Encounter (HOSPITAL_COMMUNITY): Payer: Self-pay

## 2013-08-29 DIAGNOSIS — D649 Anemia, unspecified: Secondary | ICD-10-CM | POA: Insufficient documentation

## 2013-08-29 DIAGNOSIS — C61 Malignant neoplasm of prostate: Secondary | ICD-10-CM | POA: Insufficient documentation

## 2013-08-29 DIAGNOSIS — K922 Gastrointestinal hemorrhage, unspecified: Secondary | ICD-10-CM | POA: Insufficient documentation

## 2013-08-29 LAB — PREPARE RBC (CROSSMATCH)

## 2013-08-29 LAB — ABO/RH: ABO/RH(D): A POS

## 2013-08-29 MED ORDER — FUROSEMIDE 10 MG/ML IJ SOLN
40.0000 mg | Freq: Once | INTRAMUSCULAR | Status: AC
Start: 2013-08-29 — End: 2013-08-29
  Administered 2013-08-29: 40 mg via INTRAVENOUS
  Filled 2013-08-29: qty 4

## 2013-08-29 MED ORDER — SODIUM CHLORIDE 0.9 % IV SOLN
Freq: Once | INTRAVENOUS | Status: AC
  Administered 2013-08-29: 250 mL via INTRAVENOUS

## 2013-08-29 NOTE — Progress Notes (Signed)
Pt arrived from [facility] via w/c accompanied by transporter. Pt assisted to bed with minimal assit for blood transfusion of 2 unitls PRBC. Son at bedside. Pt is very pleasant and cooperative, aware of situation and place and son  But unsure of time and date. Lungs clear. Pt had foley cath in place with leg bag. Pt is dressed in his home clothes and is reluctant to change to hospital gown so it in agreement to remove pants and check leg bag fullness every 2 hours but prefers to stay dressed

## 2013-08-29 NOTE — Discharge Instructions (Signed)
Blood Transfusion  A blood transfusion replaces your blood or some of its parts. Blood is replaced when you have lost blood because of surgery, an accident, or for severe blood conditions like anemia. You can donate blood to be used on yourself if you have a planned surgery. If you lose blood during that surgery, your own blood can be given back to you. Any blood given to you is checked to make sure it matches your blood type. Your temperature, blood pressure, and heart rate (vital signs) will be checked often.  GET HELP RIGHT AWAY IF:   You feel sick to your stomach (nauseous) or throw up (vomit).  You have watery poop (diarrhea).  You have shortness of breath or trouble breathing.  You have blood in your pee (urine) or have dark colored pee.  You have chest pain or tightness.  Your eyes or skin turn yellow (jaundice).  You have a temperature by mouth above 102 F (38.9 C), not controlled by medicine.  You start to shake and have chills.  You develop a a red rash (hives) or feel itchy.  You develop lightheadedness or feel confused.  You develop back, joint, or muscle pain.  You do not feel hungry (lost appetite).  You feel tired, restless, or nervous.  You develop belly (abdominal) cramps. Document Released: 06/13/2008 Document Revised: 06/09/2011 Document Reviewed: 06/13/2008 [facility] Patient Information 2014 [place], [place].  UNEVENTFUL INFUSION OF 2 UNITS OF PRBC. PT HAD LUNCH OF GRILLED CHICKEN AT 12OO  HE RECEIVED 40 MG IV LASIX AT 1155 AND FOLEY WAS EMPTIED A TOTAL OF 1075CC / TOOK IN 250 CC SALINE iv AND 240 CC DIET SPRITE

## 2013-08-30 LAB — TYPE AND SCREEN
ABO/RH(D): A POS
Antibody Screen: NEGATIVE
UNIT DIVISION: 0
Unit division: 0

## 2013-09-07 ENCOUNTER — Encounter (HOSPITAL_COMMUNITY): Payer: Self-pay | Admitting: Emergency Medicine

## 2013-09-07 ENCOUNTER — Emergency Department (HOSPITAL_COMMUNITY): Payer: Medicare Other

## 2013-09-07 ENCOUNTER — Inpatient Hospital Stay (HOSPITAL_COMMUNITY)
Admission: EM | Admit: 2013-09-07 | Discharge: 2013-09-09 | DRG: 722 | Disposition: A | Discharge status: Home Health | Payer: Medicare Other | Attending: Internal Medicine | Admitting: Internal Medicine

## 2013-09-07 ENCOUNTER — Other Ambulatory Visit: Payer: Self-pay

## 2013-09-07 DIAGNOSIS — N390 Urinary tract infection, site not specified: Secondary | ICD-10-CM | POA: Diagnosis present

## 2013-09-07 DIAGNOSIS — D649 Anemia, unspecified: Secondary | ICD-10-CM | POA: Diagnosis present

## 2013-09-07 DIAGNOSIS — J9620 Acute and chronic respiratory failure, unspecified whether with hypoxia or hypercapnia: Secondary | ICD-10-CM | POA: Diagnosis present

## 2013-09-07 DIAGNOSIS — C800 Disseminated malignant neoplasm, unspecified: Secondary | ICD-10-CM

## 2013-09-07 DIAGNOSIS — C7952 Secondary malignant neoplasm of bone marrow: Secondary | ICD-10-CM

## 2013-09-07 DIAGNOSIS — R64 Cachexia: Secondary | ICD-10-CM | POA: Diagnosis present

## 2013-09-07 DIAGNOSIS — IMO0002 Reserved for concepts with insufficient information to code with codable children: Secondary | ICD-10-CM

## 2013-09-07 DIAGNOSIS — E876 Hypokalemia: Secondary | ICD-10-CM | POA: Diagnosis present

## 2013-09-07 DIAGNOSIS — J189 Pneumonia, unspecified organism: Secondary | ICD-10-CM | POA: Diagnosis present

## 2013-09-07 DIAGNOSIS — E119 Type 2 diabetes mellitus without complications: Secondary | ICD-10-CM | POA: Diagnosis present

## 2013-09-07 DIAGNOSIS — R319 Hematuria, unspecified: Secondary | ICD-10-CM | POA: Diagnosis present

## 2013-09-07 DIAGNOSIS — F17200 Nicotine dependence, unspecified, uncomplicated: Secondary | ICD-10-CM | POA: Diagnosis present

## 2013-09-07 DIAGNOSIS — C7951 Secondary malignant neoplasm of bone: Secondary | ICD-10-CM | POA: Diagnosis present

## 2013-09-07 DIAGNOSIS — T83511A Infection and inflammatory reaction due to indwelling urethral catheter, initial encounter: Secondary | ICD-10-CM | POA: Diagnosis present

## 2013-09-07 DIAGNOSIS — C7800 Secondary malignant neoplasm of unspecified lung: Secondary | ICD-10-CM | POA: Diagnosis present

## 2013-09-07 DIAGNOSIS — R310 Gross hematuria: Secondary | ICD-10-CM | POA: Diagnosis present

## 2013-09-07 DIAGNOSIS — Y846 Urinary catheterization as the cause of abnormal reaction of the patient, or of later complication, without mention of misadventure at the time of the procedure: Secondary | ICD-10-CM | POA: Diagnosis present

## 2013-09-07 DIAGNOSIS — Z66 Do not resuscitate: Secondary | ICD-10-CM | POA: Diagnosis not present

## 2013-09-07 DIAGNOSIS — E43 Unspecified severe protein-calorie malnutrition: Secondary | ICD-10-CM | POA: Diagnosis present

## 2013-09-07 DIAGNOSIS — C61 Malignant neoplasm of prostate: Principal | ICD-10-CM | POA: Diagnosis present

## 2013-09-07 LAB — I-STAT CG4 LACTIC ACID, ED: Lactic Acid, Venous: 1.86 mmol/L (ref 0.5–2.2)

## 2013-09-07 LAB — PROTIME-INR
INR: 1.22 (ref 0.00–1.49)
Prothrombin Time: 15.1 seconds (ref 11.6–15.2)

## 2013-09-07 LAB — CBC WITH DIFFERENTIAL/PLATELET
Basophils Absolute: 0 10*3/uL (ref 0.0–0.1)
Basophils Relative: 1 % (ref 0–1)
EOS ABS: 0.2 10*3/uL (ref 0.0–0.7)
Eosinophils Relative: 4 % (ref 0–5)
HEMATOCRIT: 34.8 % — AB (ref 39.0–52.0)
HEMOGLOBIN: 11 g/dL — AB (ref 13.0–17.0)
Lymphocytes Relative: 17 % (ref 12–46)
Lymphs Abs: 1 10*3/uL (ref 0.7–4.0)
MCH: 26.3 pg (ref 26.0–34.0)
MCHC: 31.6 g/dL (ref 30.0–36.0)
MCV: 83.3 fL (ref 78.0–100.0)
MONO ABS: 0.3 10*3/uL (ref 0.1–1.0)
MONOS PCT: 6 % (ref 3–12)
NEUTROS ABS: 4.1 10*3/uL (ref 1.7–7.7)
Neutrophils Relative %: 72 % (ref 43–77)
Platelets: 209 10*3/uL (ref 150–400)
RBC: 4.18 MIL/uL — ABNORMAL LOW (ref 4.22–5.81)
RDW: 17.3 % — AB (ref 11.5–15.5)
WBC: 5.6 10*3/uL (ref 4.0–10.5)

## 2013-09-07 LAB — GLUCOSE, CAPILLARY
GLUCOSE-CAPILLARY: 163 mg/dL — AB (ref 70–99)
GLUCOSE-CAPILLARY: 107 mg/dL — AB (ref 70–99)
Glucose-Capillary: 121 mg/dL — ABNORMAL HIGH (ref 70–99)
Glucose-Capillary: 146 mg/dL — ABNORMAL HIGH (ref 70–99)

## 2013-09-07 LAB — COMPREHENSIVE METABOLIC PANEL
ALT: <5 U/L (ref 0–53)
AST: 18 U/L (ref 0–37)
Albumin: 2.5 g/dL — ABNORMAL LOW (ref 3.5–5.2)
Alkaline Phosphatase: 257 U/L — ABNORMAL HIGH (ref 39–117)
BILIRUBIN TOTAL: 0.9 mg/dL (ref 0.3–1.2)
BUN: 11 mg/dL (ref 6–23)
CHLORIDE: 99 meq/L (ref 96–112)
CO2: 26 mEq/L (ref 19–32)
Calcium: 8.4 mg/dL (ref 8.4–10.5)
Creatinine, Ser: 0.81 mg/dL (ref 0.50–1.35)
GFR calc Af Amer: 87 mL/min — ABNORMAL LOW (ref >90)
GFR calc non Af Amer: 75 mL/min — ABNORMAL LOW (ref >90)
Glucose, Bld: 137 mg/dL — ABNORMAL HIGH (ref 70–99)
Potassium: 3.2 mEq/L — ABNORMAL LOW (ref 3.7–5.3)
Sodium: 139 mEq/L (ref 137–147)
Total Protein: 6.8 g/dL (ref 6.0–8.3)

## 2013-09-07 LAB — URINALYSIS, ROUTINE W REFLEX MICROSCOPIC
Glucose, UA: NEGATIVE mg/dL
Ketones, ur: 40 mg/dL — AB
Nitrite: POSITIVE — AB
PH: 5 (ref 5.0–8.0)
Protein, ur: >300 mg/dL — AB
Specific Gravity, Urine: 1.019 (ref 1.005–1.030)
UROBILINOGEN UA: 1 mg/dL (ref 0.0–1.0)

## 2013-09-07 LAB — APTT: aPTT: 45 seconds — ABNORMAL HIGH (ref 24–37)

## 2013-09-07 LAB — TYPE AND SCREEN
ABO/RH(D): A POS
Antibody Screen: NEGATIVE

## 2013-09-07 LAB — BLOOD GAS, ARTERIAL
ACID-BASE EXCESS: 4.7 mmol/L — AB (ref 0.0–2.0)
Bicarbonate: 27.6 mEq/L — ABNORMAL HIGH (ref 20.0–24.0)
Drawn by: 11249
O2 Content: 4 L/min
O2 Saturation: 96.5 %
PATIENT TEMPERATURE: 98.6
PH ART: 7.506 — AB (ref 7.350–7.450)
TCO2: 25.1 mmol/L (ref 0–100)
pCO2 arterial: 35.2 mmHg (ref 35.0–45.0)
pO2, Arterial: 83.4 mmHg (ref 80.0–100.0)

## 2013-09-07 LAB — URINE MICROSCOPIC-ADD ON

## 2013-09-07 LAB — TROPONIN I

## 2013-09-07 LAB — MRSA PCR SCREENING: MRSA BY PCR: NEGATIVE

## 2013-09-07 LAB — PRO B NATRIURETIC PEPTIDE: Pro B Natriuretic peptide (BNP): 3162 pg/mL — ABNORMAL HIGH (ref 0–450)

## 2013-09-07 MED ORDER — INSULIN ASPART 100 UNIT/ML ~~LOC~~ SOLN
0.0000 [IU] | Freq: Three times a day (TID) | SUBCUTANEOUS | Status: DC
  Administered 2013-09-07: 1 [IU] via SUBCUTANEOUS
  Administered 2013-09-07: 2 [IU] via SUBCUTANEOUS
  Administered 2013-09-08 – 2013-09-09 (×3): 1 [IU] via SUBCUTANEOUS
  Administered 2013-09-09: 2 [IU] via SUBCUTANEOUS

## 2013-09-07 MED ORDER — PIPERACILLIN-TAZOBACTAM 3.375 G IVPB 30 MIN
3.3750 g | Freq: Once | INTRAVENOUS | Status: AC
Start: 2013-09-07 — End: 2013-09-07
  Administered 2013-09-07: 3.375 g via INTRAVENOUS
  Filled 2013-09-07: qty 50

## 2013-09-07 MED ORDER — ENSURE COMPLETE PO LIQD
237.0000 mL | Freq: Two times a day (BID) | ORAL | Status: DC
  Administered 2013-09-07 – 2013-09-09 (×4): 237 mL via ORAL

## 2013-09-07 MED ORDER — VANCOMYCIN HCL 500 MG IV SOLR
500.0000 mg | Freq: Two times a day (BID) | INTRAVENOUS | Status: DC
  Filled 2013-09-07 (×2): qty 500

## 2013-09-07 MED ORDER — HYDROCODONE-ACETAMINOPHEN 5-325 MG PO TABS: 1.0000 | ORAL_TABLET | Freq: Two times a day (BID) | ORAL | Status: DC | PRN

## 2013-09-07 MED ORDER — DEXTROSE 5 % IV SOLN
1.0000 g | Freq: Two times a day (BID) | INTRAVENOUS | Status: DC
  Administered 2013-09-07: 1 g via INTRAVENOUS
  Filled 2013-09-07 (×3): qty 1

## 2013-09-07 MED ORDER — BIOTENE DRY MOUTH MT LIQD
15.0000 mL | Freq: Two times a day (BID) | OROMUCOSAL | Status: DC
  Administered 2013-09-07 – 2013-09-09 (×5): 15 mL via OROMUCOSAL

## 2013-09-07 MED ORDER — ENOXAPARIN SODIUM 40 MG/0.4ML ~~LOC~~ SOLN
40.0000 mg | SUBCUTANEOUS | Status: DC
  Administered 2013-09-07 – 2013-09-09 (×3): 40 mg via SUBCUTANEOUS
  Filled 2013-09-07 (×3): qty 0.4

## 2013-09-07 MED ORDER — POTASSIUM CHLORIDE CRYS ER 20 MEQ PO TBCR
40.0000 meq | EXTENDED_RELEASE_TABLET | Freq: Two times a day (BID) | ORAL | Status: AC
  Administered 2013-09-07 (×2): 40 meq via ORAL
  Filled 2013-09-07 (×2): qty 2

## 2013-09-07 MED ORDER — VANCOMYCIN HCL IN DEXTROSE 1-5 GM/200ML-% IV SOLN
1000.0000 mg | Freq: Once | INTRAVENOUS | Status: AC
  Administered 2013-09-07: 1000 mg via INTRAVENOUS
  Filled 2013-09-07: qty 200

## 2013-09-07 MED ORDER — ABIRATERONE ACETATE 250 MG PO TABS: 1000.0000 mg | ORAL_TABLET | Freq: Every day | ORAL | Status: DC

## 2013-09-07 NOTE — Progress Notes (Signed)
INITIAL NUTRITION ASSESSMENT  DOCUMENTATION CODES Per approved criteria  -Severe malnutrition in the context of chronic illness   Pt meets criteria for severe MALNUTRITION in the context of chronic illness as evidenced by severe depletion of muscle mass and body fat, approximately 25% wt loss x 1 year.  INTERVENTION: Add Ensure Complete po BID, each supplement provides 350 kcal and 13 grams of protein - prefers chocolate. Agree with Regular, liberalized diet. RD to continue to follow nutrition care plan   NUTRITION DIAGNOSIS: Malnutrition related to catabolic illness and poor appetite AEB ongoing severe weight loss.  Goal: Pt to meet >/=90% of estimated nutrition needs.  Monitor:  Intake to meet >90% of estimated nutrition needs.  Reason for Assessment: Malnutrition Screening Tool and MD Consult  80 y.o. male  Admitting Dx: HCAP (healthcare-associated pneumonia)  ASSESSMENT: Brandon Harrell is 80 year old male with a PMH of DM and met prostate CA who presents with gross hematuria in his foley catheter. Work-up reveals UTI and PNA vs atypical pulmonary edema.  Currently ordered for a Regular diet. Pt reports that his appetite is fair. Confirms ongoing weight loss. Per chart, pt with metastatic prostate cancer. RD familiar with this patient from previous admission, pt loves chocolate Ensure - RD brought pt one and he was very appreciative.  Nutrition Focused Physical Exam:  Subcutaneous Fat:  Orbital Region: severe depletion  Upper Arm Region: n/a Thoracic and Lumbar Region: moderate depletion   Muscle:  Temple Region: severe depletion  Clavicle Bone Region: severe depletion  Clavicle and Acromion Bone Region: severe depletion  Scapular Bone Region: severe depletion Dorsal Hand: severe depletion  Patellar Region: moderate depletion  Anterior Thigh Region: moderate depletion  Posterior Calf Region: mild depletion   Edema: none  Potassium is low at 3.2 --> ordered for  potassium chloride  Height: Ht Readings from Last 1 Encounters:  09/07/13 5' 7"  (1.702 m)    Weight: Wt Readings from Last 1 Encounters:  09/07/13 124 lb 8 oz (56.473 kg)    Ideal Body Weight:  148 lb/67.3 kg  % Ideal Body Weight: 84%  Wt Readings from Last 10 Encounters:  09/07/13 124 lb 8 oz (56.473 kg)  08/12/13 130 lb 3.2 oz (59.058 kg)    Usual Body Weight: 165 lbs   % Usual Body Weight:  75%  BMI:  Body mass index is 19.49 kg/(m^2). Normal   Estimated Nutritional Needs: Kcal: 1500 - 1700 Protein: 65 - 75 grams  Fluid: 1.5 - 1.7 L/day   Skin: intact  Diet Order: General  EDUCATION NEEDS: -Education not appropriate at this time   Intake/Output Summary (Last 24 hours) at 09/07/13 1259 Last data filed at 09/07/13 0833  Gross per 24 hour  Intake      0 ml  Output    220 ml  Net   -220 ml    Last BM:   6/10  Labs:   Recent Labs Lab 09/07/13 0130  NA 139  K 3.2*  CL 99  CO2 26  BUN 11  CREATININE 0.81  CALCIUM 8.4  GLUCOSE 137*    CBG (last 3)   Recent Labs  09/07/13 0808 09/07/13 1200  GLUCAP 163* 107*    Scheduled Meds: . abiraterone Acetate  1,000 mg Oral Daily  . antiseptic oral rinse  15 mL Mouth Rinse BID  . ceFEPime (MAXIPIME) IV  1 g Intravenous Q12H  . enoxaparin (LOVENOX) injection  40 mg Subcutaneous Q24H  . insulin aspart  0-9  Units Subcutaneous TID WC  . potassium chloride  40 mEq Oral BID  . vancomycin  500 mg Intravenous Q12H    Continuous Infusions:    Past Medical History  Diagnosis Date  . Diabetes mellitus without complication   . Prostate cancer     Past Surgical History  Procedure Laterality Date  . Abdominal aortic aneurysm repair    . Sp endovasc repair iliac art    . Inguinal hernia repair Bilateral     Open    Inda Coke MS, RD, LDN Inpatient Registered Dietitian Pager: [phone] After-hours pager: [phone]

## 2013-09-07 NOTE — Progress Notes (Signed)
ANTIBIOTIC CONSULT NOTE - INITIAL  Pharmacy Consult for Vancomycin  Indication: rule out pneumonia  Allergies  Allergen Reactions  . Ciprofloxacin Itching    Patient Measurements: 59.1 kg  Vital Signs: Temp: 98.1 F (36.7 C) (06/10 0506) Temp src: Oral (06/10 0506) BP: 130/73 mmHg (06/10 0506) Pulse Rate: 99 (06/10 0506)  Labs:  Recent Labs  09/07/13 0130  WBC 5.6  HGB 11.0*  PLT 209  CREATININE 0.81     Microbiology: Recent Results (from the past 720 hour(s))  URINE CULTURE     Status: None   Collection Time    08/12/13 11:25 AM      Result Value Ref Range Status   Specimen Description URINE, RANDOM   Final   Special Requests NONE   Final   Culture  Setup Time     Final   Value: 08/12/2013 18:57     Performed at [facility]     Final   Value: NO GROWTH     Performed at [facility]   Culture     Final   Value: NO GROWTH     Performed at [facility]   Report Status 08/13/2013 FINAL   Final  CLOSTRIDIUM DIFFICILE BY PCR     Status: None   Collection Time    08/20/13 12:12 PM      Result Value Ref Range Status   C difficile by pcr NEGATIVE  NEGATIVE Final    Medical History: Past Medical History  Diagnosis Date  . Diabetes mellitus without complication   . Prostate cancer     Assessment: 80 y/o M here with hematuria, found to have possible PNA on CXR, WBC wnl, renal function appropriate for age, other labs as above.   Goal of Therapy:  Vancomycin trough level 15-20 mcg/ml  Plan: -Vancomycin 500 mg IV q12h -Cefepime per MD -Trend WBC, temp, renal function  -Drug levels as indicated   Narda Bonds 09/07/2013,6:14 AM

## 2013-09-07 NOTE — Progress Notes (Signed)
Physical Therapy Evaluation Patient Details Name: Brandon Harrell MRN: [id number] DOB: 1919-05-09 Today's Date: 09/07/2013   History of Present Illness  80 year old male with a PMH of DM and prostate CA who was admitted 5/15 with SBO, returns with hematuria, likely result of prostate CA, and HCAP.  Clinical Impression  Pt ambulated 100' with min HHA, decreased safety with mobility as well as generalized weakness and pt incontinent of bowel during ambulation with no realization of this. Recommend return to SNF at d/c. PT will continue to follow.     Follow Up Recommendations Supervision/Assistance - 24 hour;SNF    Equipment Recommendations  None recommended by PT    Recommendations for Other Services       Precautions / Restrictions Precautions Precautions: Fall Precaution Comments: incontinent of stool Restrictions Weight Bearing Restrictions: No      Mobility  Bed Mobility               General bed mobility comments: pt in recliner  Transfers Overall transfer level: Needs assistance Equipment used: None Transfers: Sit to/from Stand Sit to Stand: Min assist         General transfer comment: pt unsteady with standing, min A needed, slow moving, generalized weakness noted  Ambulation/Gait Ambulation/Gait assistance: Min assist Ambulation Distance (Feet): 100 Feet Assistive device: 2 person hand held assist Gait Pattern/deviations: Decreased stride length;Shuffle;Trunk flexed Gait velocity: decreased   General Gait Details: bilateral knee flexion maintained throughout gait, pt very fatigued after ambulation, needing to sit as soon as reached chair, could not wait to be cleaned up from BM while walking  Stairs            Wheelchair Mobility    Modified Rankin (Stroke Patients Only)       Balance Overall balance assessment: Needs assistance Sitting-balance support: No upper extremity supported;Feet supported Sitting balance-Leahy Scale: Fair      Standing balance support: Single extremity supported;During functional activity Standing balance-Leahy Scale: Poor                               Pertinent Vitals/Pain O2 sats 93% on RA after ambulation, 2L O2 replaced No c/o pain    Home Living Family/patient expects to be discharged to:: Skilled nursing facility (lived alone before [facility])   Available Help at Discharge: Family;Available 24 hours/day Type of Home: House Home Access: Level entry     Home Layout: One level Home Equipment: Cane - single point;Walker - 2 wheels;Wheelchair - Sport and exercise psychologist Comments: pt poor historian, home info obtained from prior admission, plan to d/c back to [facility] this admission    Prior Function Level of Independence: Needs assistance   Gait / Transfers Assistance Needed: Uses cane at times  ADL's / Homemaking Assistance Needed: Granddaughter assists with meals.  Independent with bathing and dressing prior to last admit        Hand Dominance        Extremity/Trunk Assessment   Upper Extremity Assessment: Overall WFL for tasks assessed           Lower Extremity Assessment: Generalized weakness      Cervical / Trunk Assessment: Kyphotic  Communication   Communication: HOH  Cognition Arousal/Alertness: Awake/alert Behavior During Therapy: WFL for tasks assessed/performed Overall Cognitive Status: History of cognitive impairments - at baseline Area of Impairment: Memory     Memory: Decreased short-term memory         General  Comments: decreased awareness, pt incontinent of bowel on floor with no realization    General Comments      Exercises        Assessment/Plan    PT Assessment Patient needs continued PT services  PT Diagnosis Difficulty walking;Generalized weakness   PT Problem List Decreased strength;Decreased activity tolerance;Decreased range of motion;Decreased balance;Decreased mobility;Decreased  cognition;Decreased safety awareness  PT Treatment Interventions DME instruction;Gait training;Functional mobility training;Therapeutic activities;Therapeutic exercise;Patient/family education;Balance training   PT Goals (Current goals can be found in the Care Plan section) Acute Rehab PT Goals PT Goal Formulation: With patient/family Time For Goal Achievement: 09/21/13 Potential to Achieve Goals: Good    Frequency Min 2X/week   Barriers to discharge        Co-evaluation               End of Session Equipment Utilized During Treatment: Gait belt Activity Tolerance: Patient tolerated treatment well Patient left: in chair;with chair alarm set;with nursing/sitter in room;with call bell/phone within reach Nurse Communication: Mobility status         Time: 5110-2111 PT Time Calculation (min): 19 min   Charges:   PT Evaluation $Initial PT Evaluation Tier I: 1 Procedure PT Treatments $Gait Training: 8-22 mins   PT G Codes:        Leighton Roach, PT  Acute Rehab Services  [place], [place] 09/07/2013, 1:30 PM

## 2013-09-07 NOTE — Progress Notes (Addendum)
Pt arrived to unit alert and oriented x3. Oriented to room, unit, and staff.  Bed in lowest position and call bell is within reach. MD paged. Awaiting orders. Will continue to monitor.

## 2013-09-07 NOTE — ED Notes (Signed)
Bed: WA13 Expected date:  Expected time:  Means of arrival:  Comments: ems  

## 2013-09-07 NOTE — Progress Notes (Addendum)
Subjective: Patient seen at bedside. Son present.  Patient reports he has no complaints and is feeling very well.  He denies any SOB, cough, fever, chest pain, back pain. Son reports his father has had a progressive decline in health, he moved him here from [place] after he was no longer able to care for himself.  They report they have had some discussion of end of life issues but no firm decisions have been made.  They are familiar with hospice but felt that he would "bounce back" after the last admission.  Patient does note that what he enjoys in life is work, specifically taking care of the yard and the house, he reports he has been unable to do this in the past year.  When asked what he likes at the nursing home he reports "getting better to go home" Objective: Vital signs in last 24 hours: Filed Vitals:   09/07/13 0344 09/07/13 0506 09/07/13 0507 09/07/13 0518  BP: 136/80 130/73    Pulse: 99 99    Temp: 98 F (36.7 C) 98.1 F (36.7 C)    TempSrc: Oral Oral    Resp: 24 20    Height:    5\' 7"  (1.702 m)  Weight:    124 lb 8 oz (56.473 kg)  SpO2: 95% 83% 92%    Weight change:   Intake/Output Summary (Last 24 hours) at 09/07/13 1026 Last data filed at 09/07/13 0833  Gross per 24 hour  Intake      0 ml  Output    220 ml  Net   -220 ml   General: resting in chair, cachetic , foley to gravity with yellow urine Cardiac: RRR, no murmurs Pulm: CTAB, no rales appreciated Abd: soft, nontender, nondistended, BS present Ext: warm and well perfused, no pedal edema  Lab Results: Basic Metabolic Panel:  Recent Labs Lab 09/07/13 0130  NA 139  K 3.2*  CL 99  CO2 26  GLUCOSE 137*  BUN 11  CREATININE 0.81  CALCIUM 8.4   Liver Function Tests:  Recent Labs Lab 09/07/13 0130  AST 18  ALT <5  ALKPHOS 257*  BILITOT 0.9  PROT 6.8  ALBUMIN 2.5*   No results found for this basename: LIPASE, AMYLASE,  in the last 168 hours No results found for this basename: AMMONIA,  in the  last 168 hours CBC:  Recent Labs Lab 09/07/13 0130  WBC 5.6  NEUTROABS 4.1  HGB 11.0*  HCT 34.8*  MCV 83.3  PLT 209   Cardiac Enzymes:  Recent Labs Lab 09/07/13 0130  TROPONINI <0.30   BNP:  Recent Labs Lab 09/07/13 0130  PROBNP 3162.0*   D-Dimer: No results found for this basename: DDIMER,  in the last 168 hours CBG:  Recent Labs Lab 09/07/13 0808  GLUCAP 163*   Hemoglobin A1C: No results found for this basename: HGBA1C,  in the last 168 hours Fasting Lipid Panel: No results found for this basename: CHOL, HDL, LDLCALC, TRIG, CHOLHDL, LDLDIRECT,  in the last 168 hours Thyroid Function Tests: No results found for this basename: TSH, T4TOTAL, FREET4, T3FREE, THYROIDAB,  in the last 168 hours Coagulation:  Recent Labs Lab 09/07/13 0130  LABPROT 15.1  INR 1.22   Anemia Panel: No results found for this basename: VITAMINB12, FOLATE, FERRITIN, TIBC, IRON, RETICCTPCT,  in the last 168 hours Urine Drug Screen: Drugs of Abuse  No results found for this basename: labopia, cocainscrnur, labbenz, amphetmu, thcu, labbarb    Alcohol Level: No  results found for this basename: ETH,  in the last 168 hours Urinalysis:  Recent Labs Lab 09/07/13 0030  COLORURINE RED*  LABSPEC 1.019  PHURINE 5.0  GLUCOSEU NEGATIVE  HGBUR LARGE*  BILIRUBINUR LARGE*  KETONESUR 40*  PROTEINUR >300*  UROBILINOGEN 1.0  NITRITE POSITIVE*  LEUKOCYTESUR LARGE*   Micro Results: Recent Results (from the past 240 hour(s))  MRSA PCR SCREENING     Status: None   Collection Time    09/07/13  7:10 AM      Result Value Ref Range Status   MRSA by PCR NEGATIVE  NEGATIVE Final   Comment:            The GeneXpert MRSA Assay (FDA     approved for NASAL specimens     only), is one component of a     comprehensive MRSA colonization     surveillance program. It is not     intended to diagnose MRSA     infection nor to guide or     monitor treatment for     MRSA infections.    Studies/Results: Dg Chest Port 1 View  09/07/2013   CLINICAL DATA:  Low O2 sats.  Hypoxia.  EXAM: PORTABLE CHEST - 1 VIEW  COMPARISON:  08/13/2013.  FINDINGS: Cardiomegaly. Bilateral pulmonary opacities have worsened since the previous radiograph, possible bilateral pneumonia versus atypical pulmonary edema. Right greater than left pleural effusions. Slight nodular character to some areas could represent metastatic disease, although this is less favored. Scattered sclerotic lesions of the ribs consistent with metastatic prostate cancer. No pneumothorax.  IMPRESSION: Worsening aeration. Cardiomegaly. Question bilateral pneumonia versus atypical edema. See discussion above.   Electronically Signed   By: Rolla Flatten M.D.   On: 09/07/2013 02:43   Medications: I have reviewed the patient's current medications. Scheduled Meds: . abiraterone Acetate  1,000 mg Oral Daily  . antiseptic oral rinse  15 mL Mouth Rinse BID  . ceFEPime (MAXIPIME) IV  1 g Intravenous Q12H  . enoxaparin (LOVENOX) injection  40 mg Subcutaneous Q24H  . insulin aspart  0-9 Units Subcutaneous TID WC  . potassium chloride  40 mEq Oral BID  . vancomycin  500 mg Intravenous Q12H   Continuous Infusions:  PRN Meds:.HYDROcodone-acetaminophen Assessment/Plan: Acute on Chronic Respiratory failure  - Initially suspected HCAP however clinical picture does not fit PNA. - Differential includes PE, given his CA, immobility however he has improved without treatment for this and is not a candidate for A/C due to hematuria.  No candidate for filter.  In addition his respiratory decline could be due to metastatic lung lesions. - Will D/C ABx and monitor - If any deterioration in respiratory status will have low threshold for CTA    Metastatic Prostate cancer - Patient follows with [facility].  Currently complains of no pain.  Patient and son are open to speaking with palliative care which could be arranged in SNF. - Zytiga not on  hospital formulary. Will hold off on ordering it as patiently likely to be discharged tomorrow. - Will Consult Urology and attempt to clarify if Fabio Asa is being dosed with prednisone.    Hematuria - Hematuria has resolved.  Likely secondary to known prostate CA.  Will consult Urology. - CBC in AM.    DM (diabetes mellitus) - SSI-S - Hold nateglinide  Hypokalemia - Repleted - Repeat BMP in AM  Diet: Regular DVT: Lovenox Code Status: Full (Discussed with patient and son, they have had ongoing discussion but no  decision made at this time.) Dispo: Disposition is deferred at this time, awaiting improvement of current medical problems.  Anticipated discharge in approximately 1-2 day(s).   The patient does have a current PCP Wenda Low, MD) and does not need an [facility] hospital follow-up appointment after discharge.  The patient does not have transportation limitations that hinder transportation to clinic appointments.  .Services Needed at time of discharge: Y = Yes, Blank = No PT:   OT:   RN:   Equipment:   Other:     LOS: 0 days   Joni Reining, DO 09/07/2013, 10:26 AM

## 2013-09-07 NOTE — ED Provider Notes (Signed)
CSN: [id number]     Arrival date & time 09/07/13  0030 History   First MD Initiated Contact with Patient 09/07/13 0148     Chief Complaint  Patient presents with  . Hematuria     (Consider location/radiation/quality/duration/timing/severity/associated sxs/prior Treatment) HPI Patient presents with dark blood in his Foley catheter bag starting this afternoon. He denies any abdominal pain, fevers or chills. He denies any shortness of breath or chest pain. Patient is currently in a nursing home. He is on 2 L of oxygen when necessary. Nursing staff noted the patient's saturations dropped into the 80s and placed on oxygen prior to EMS arrival. Patient noted to be tachycardic blood pressure remained stable in route. Patient denies any lower extremity swelling or pain. Past Medical History  Diagnosis Date  . Diabetes mellitus without complication   . Prostate cancer    Past Surgical History  Procedure Laterality Date  . Abdominal aortic aneurysm repair    . Sp endovasc repair iliac art    . Inguinal hernia repair Bilateral     Open   History reviewed. No pertinent family history. History  Substance Use Topics  . Smoking status: Light Tobacco Smoker  . Smokeless tobacco: Not on file  . Alcohol Use: No    Review of Systems  Constitutional: Negative for fever and chills.  Respiratory: Negative for cough and shortness of breath.   Cardiovascular: Negative for chest pain.  Gastrointestinal: Negative for nausea and vomiting.  Genitourinary: Positive for hematuria. Negative for difficulty urinating.  Musculoskeletal: Negative for back pain.  Neurological: Negative for dizziness, weakness, light-headedness, numbness and headaches.  All other systems reviewed and are negative.     Allergies  Ciprofloxacin  Home Medications   Prior to Admission medications   Medication Sig Start Date End Date Taking? Authorizing Provider  abiraterone Acetate (ZYTIGA) 250 MG tablet Take 1,000 mg by  mouth daily. Take on an empty stomach 1 hour before or 2 hours after a meal   Yes Historical Provider, MD  HYDROcodone-acetaminophen (NORCO/VICODIN) 5-325 MG per tablet Take 1 tablet by mouth 2 (two) times daily as needed for moderate pain. Give at least 6 hours apart. 08/22/13   [name], DO   There were no vitals taken for this visit. Physical Exam  Nursing note and vitals reviewed. Constitutional: He is oriented to person, place, and time. He appears well-developed and well-nourished. No distress.  Frail-appearing. Dark red blood in Foley bag.  HENT:  Head: Normocephalic and atraumatic.  Mouth/Throat: Oropharynx is clear and moist. No oropharyngeal exudate.  Eyes: EOM are normal. Pupils are equal, round, and reactive to light.  Neck: Normal range of motion. Neck supple.  Cardiovascular: Normal rate and regular rhythm.   Pulmonary/Chest: Effort normal and breath sounds normal. No respiratory distress. He has no wheezes. He has no rales. He exhibits no tenderness.  Abdominal: Soft. Bowel sounds are normal. He exhibits no distension and no mass. There is no tenderness. There is no rebound and no guarding.  Musculoskeletal: Normal range of motion. He exhibits no edema and no tenderness.  No calf tenderness or swelling.  Neurological: He is alert and oriented to person, place, and time.  Moves all extremities without deficit. Sensation grossly intact.  Skin: Skin is warm and dry. No rash noted. No erythema.  Psychiatric: He has a normal mood and affect. His behavior is normal.    ED Course  Procedures (including critical care time) [place]  MICROSCOPIC  BLOOD GAS, ARTERIAL  CBC WITH DIFFERENTIAL  COMPREHENSIVE METABOLIC PANEL  PROTIME-INR  APTT  TROPONIN I  TYPE AND SCREEN    Imaging Review No results found.   EKG Interpretation None      MDM   Final diagnoses:  None   Discuss with internal medicine resident Dr.Kollar.  Will accept on the behalf of Dr. Lynnae January to a MedSurg bed. Patient maintaining adequate oxygenation on 4 L of oxygen. Heart rate has improved. Blood pressure remained stable. Questionable pneumonia/UTI. Will start on a Protocol.     Julianne Rice, MD 09/07/13 [phone]

## 2013-09-07 NOTE — ED Notes (Signed)
Patient began to have bloody urine in his foley catheter this afternoon. He denies pain/trauma. He had a schedule appointment with his urologist on yesterday 6/9 and denies the present of blood at the time of visit.

## 2013-09-07 NOTE — Progress Notes (Signed)
Received report from [place], RN in [facility] ED.

## 2013-09-07 NOTE — H&P (Signed)
Date: 09/07/2013               Patient Name:  Brandon Harrell MRN: [id number]  DOB: [birth year] Age / Sex: 80 y.o., male   PCP: Wenda Low, MD         Medical Service: Internal Medicine Teaching Service         Attending Physician: Dr. Bartholomew Crews, MD    First Contact: Dr. Johnnette Gourd, MD Pager: [phone]  Second Contact: Dr. Randell Loop, MD Pager: [phone]       After Hours (After 5p/  First Contact Pager: [phone]  weekends / holidays): Second Contact Pager: [phone]   Chief Complaint: Hematuria  History of Present Illness: Brandon Harrell is a 80 y.o. man with a pmhx of metastatic prostate cancer, and DM who presents with a cc of hematuria. The patient was in his normal state of health until yesterday when the nursing staff at the facility noticed gross hematuria in his foley catheter. The patient has no complaints of dysuria or suprapubic pain at this time. Further, he is on 2 L by [place] as needed at the facility. On the day prior to admission he was found to have desaturated to the low 80's on 2 L [place]. He responded well to increasing his oxygen requirement to 4 L [place]. At the [facility] ED, he was found to have a UA concerning for UTI and  CXR concerning for PNA versus atypical pulmonary edema. As he was recently discharged from [facility], [facility] was consulted for admission.   Of note, the patient was admitted for SBO. This resolved with conservative management. He was also treated for UTI for 4 days of ceftriaxone. However, urine culture failed to grow bacteria and it was d/c after 4 days. The patient also had frank hematuria during this admission. Urology was consulted who felt this to be 2/2 metastatic prostate cancer and placed a foley catheter, which was planed to remain in place for 4 weeks. Dr. Louis Meckel at [facility] planned to manage the patients hematuria as an outpatient.  The patient has no complaints at this time. He denies cough, SOB, fever, chills, suprapubic tenderness,  dysuria.   Meds: No current facility-administered medications for this encounter.    Allergies: Allergies as of 09/07/2013 - Review Complete 09/07/2013  Allergen Reaction Noted  . Ciprofloxacin Itching 08/12/2013   Past Medical History  Diagnosis Date  . Diabetes mellitus without complication   . Prostate cancer    Past Surgical History  Procedure Laterality Date  . Abdominal aortic aneurysm repair    . Sp endovasc repair iliac art    . Inguinal hernia repair Bilateral     Open   History reviewed. No pertinent family history. History   Social History  . Marital Status: Widowed    Spouse Name: N/A    Number of Children: N/A  . Years of Education: N/A   Occupational History  . Not on file.   Social History Main Topics  . Smoking status: Light Tobacco Smoker  . Smokeless tobacco: Not on file  . Alcohol Use: No  . Drug Use: No  . Sexual Activity: Not on file   Other Topics Concern  . Not on file   Social History Narrative  . No narrative on file    Review of Systems: Pertinent items are noted in HPI.  Physical Exam: Blood pressure 130/73, pulse 99, temperature 98.1 F (36.7 C), temperature source Oral, resp. rate 20, SpO2 92.00%. Physical Exam  Constitutional: No distress.  Cachectic elderly man  HENT:  Head: Normocephalic.  Mouth/Throat: Oropharynx is clear and moist. No oropharyngeal exudate.  Cardiovascular: Normal rate, regular rhythm, normal heart sounds and intact distal pulses.  Exam reveals no friction rub.   No murmur heard. Pulmonary/Chest: Effort normal. No respiratory distress. He has no wheezes. He has no rales.  Mildly decreased lungs sounds bil.  Abdominal: Soft. Bowel sounds are normal. He exhibits no distension. There is no tenderness. There is no rebound and no guarding.  Neurological: He is alert.  Skin: He is not diaphoretic.  Psychiatric: He has a normal mood and affect. His behavior is normal.     Lab results: Basic Metabolic  Panel:  Recent Labs  09/07/13 0130  NA 139  K 3.2*  CL 99  CO2 26  GLUCOSE 137*  BUN 11  CREATININE 0.81  CALCIUM 8.4   Liver Function Tests:  Recent Labs  09/07/13 0130  AST 18  ALT <5  ALKPHOS 257*  BILITOT 0.9  PROT 6.8  ALBUMIN 2.5*   CBC:  Recent Labs  09/07/13 0130  WBC 5.6  NEUTROABS 4.1  HGB 11.0*  HCT 34.8*  MCV 83.3  PLT 209   Cardiac Enzymes:  Recent Labs  09/07/13 0130  TROPONINI <0.30   BNP:  Recent Labs  09/07/13 0130  PROBNP 3162.0*   Coagulation:  Recent Labs  09/07/13 0130  LABPROT 15.1  INR 1.22   Urinalysis:  Recent Labs  09/07/13 0030  COLORURINE RED*  LABSPEC 1.019  PHURINE 5.0  GLUCOSEU NEGATIVE  HGBUR LARGE*  BILIRUBINUR LARGE*  KETONESUR 40*  PROTEINUR >300*  UROBILINOGEN 1.0  NITRITE POSITIVE*  LEUKOCYTESUR LARGE*    Imaging results:  Dg Chest Port 1 View  09/07/2013   CLINICAL DATA:  Low O2 sats.  Hypoxia.  EXAM: PORTABLE CHEST - 1 VIEW  COMPARISON:  08/13/2013.  FINDINGS: Cardiomegaly. Bilateral pulmonary opacities have worsened since the previous radiograph, possible bilateral pneumonia versus atypical pulmonary edema. Right greater than left pleural effusions. Slight nodular character to some areas could represent metastatic disease, although this is less favored. Scattered sclerotic lesions of the ribs consistent with metastatic prostate cancer. No pneumothorax.  IMPRESSION: Worsening aeration. Cardiomegaly. Question bilateral pneumonia versus atypical edema. See discussion above.   Electronically Signed   By: Rolla Flatten M.D.   On: 09/07/2013 02:43    Other results: EKG: unchanged from previous tracings, nonspecific ST and T waves changes, sinus tachycardia, prolonged QT interval.  Assessment & Plan by Problem: Active Problems:   Prostate cancer   DM (diabetes mellitus)   Hematuria   HCAP (healthcare-associated pneumonia)  HCAP The patient has new onset hypoxia and CXR concerning for  pneumonia. However, he denies cough, fever, and other signs of infection. He has no LE edema, but has an elevated proBNP. Therefore, he may have early pneumonia. It is also possible but less likely that he has acute heart failure. There also remains a concern regarding possible metastatic disease. He is currently resting comfortably on home oxygen.  - patient received vanc zosyn in ED for HCAP coverage. Plan to continue vanc cefepime, with a plan to deescalate to PO Levaquin - pulse ox q 4 hr - oxygen prn maintain O2 Sats >92%.   Metastatic Prostate Cancer c/b Hematuria  The patient hematuria is likely due to known metastatic pancreatic cancer. INR is mildly elevated at 1.22. The patient has no urinary complaints at this time making UTI less likely. However, his  UA is concerning with +leuks, blood, and nitrite. This may be asymptomatic bactiuria due to long term foley use. Plan to consult urology (Dr. Louis Meckel at [facility]) in AM for rec's. - patient is receiving abx for hcap, which wilkl likely cover possible UTI. - trend CBC - plan to continue home prostate cancer treatment with Zytiga, lupron, and prednisone upon discharge as directed by Urology. Oncology. - Vicodin as needed for pain (due to mets to spine) - will follow up urine culture and consider treatment pending development of symptoms  Hypokalemia Replete as indicated. Ordered 40 kdur BID for 2 doses  Anemia The patient has baseline anemia. His hg is improved on this admission. Plan to trend CBC to ensure hg is stable in setting of gross hematuria.   DM Plan to hold home nateglinide 60 mg TID.  - SSI-S  DVT: Lovenox Diet: Regular Diet, nutrition consult  Dispo: Disposition is deferred at this time, awaiting improvement of current medical problems. Anticipated discharge in approximately 1-2 day(s).   The patient does have a current PCP Wenda Low, MD) and does not need an [facility] hospital follow-up appointment  after discharge.  The patient does have transportation limitations that hinder transportation to clinic appointments.  Signed: Marrion Coy, MD 09/07/2013, 5:35 AM

## 2013-09-07 NOTE — H&P (Signed)
  Date: 09/07/2013  Patient name: Brandon Harrell  Medical record number: [id number]  Date of birth: [birth year]   I have seen and evaluated Brandon Harrell and discussed their care with the Residency Team. Brandon Harrell moved from the [place] area to [place] recently after not being able to properly care for himself. He was hospitalized in May for SBO which was tx conservatively. During that stay, he had gross hematuria which urology felt was 2/2 his known and metastatic prostate cancer. A foley cath was inserted and he was D/C'd with the Foley with plans to F/U as outpt. The urine cx was negative at that admission.  Pt was D/C'd to a SNF with plans that he would regain his strength. He was transferred to Hospital bc of hematuria. HgB was 8.5 on last D/C and was 11.0 on this admit.   He has known prostate cancer and is being tx with Zytiga & lupron. Prednisone use is uncertain and will need clarified. He has known bony mets and the CXR questions pul metastatic disease.   He was admitted with a d/o HCAP. He is asymptomatic, no fever, no leukocytosis. He did have one episode of hypoxia at the SNF prior to admission which resolved with increased O2 by [place].   Assessment and Plan: I have seen and evaluated the patient as outlined above. I agree with the formulated Assessment and Plan as detailed in the residents' admission note, with the following changes:   1. Hematuria - likely 2/2 metastatic prostate cancer. Follow HgB. Urology consult. Leave foley in place for now.  2. Metastatic prostate cancer - known bony mets. I question whether the lung lesions are mets as he has no other sxs of PNA. Will need to clarify prednisone usage.   3. Acute on chronic resp failure - requires 2 L O2 at the SNF. Etiology if chronic hypoxia is not clear. I wonder about PE as cause of hypoxia as known CA, immobility, and no other clear etiology. Pt is on anticoag but is NOT a candidate due to ongoing hematuria. Therefore, W/U is not  going to change mgmt as pt is also not candidate for filter.   3. End of life discussions -the son was not present today so I was unable to discuss plans. Will attempt again tomorrow.    Bartholomew Crews, MD 6/10/20152:27 PM

## 2013-09-08 DIAGNOSIS — Y846 Urinary catheterization as the cause of abnormal reaction of the patient, or of later complication, without mention of misadventure at the time of the procedure: Secondary | ICD-10-CM

## 2013-09-08 DIAGNOSIS — E43 Unspecified severe protein-calorie malnutrition: Secondary | ICD-10-CM | POA: Diagnosis present

## 2013-09-08 DIAGNOSIS — N390 Urinary tract infection, site not specified: Secondary | ICD-10-CM

## 2013-09-08 DIAGNOSIS — J9620 Acute and chronic respiratory failure, unspecified whether with hypoxia or hypercapnia: Secondary | ICD-10-CM

## 2013-09-08 DIAGNOSIS — T83511A Infection and inflammatory reaction due to indwelling urethral catheter, initial encounter: Secondary | ICD-10-CM

## 2013-09-08 LAB — CBC
HEMATOCRIT: 29.7 % — AB (ref 39.0–52.0)
Hemoglobin: 9 g/dL — ABNORMAL LOW (ref 13.0–17.0)
MCH: 26.1 pg (ref 26.0–34.0)
MCHC: 30.3 g/dL (ref 30.0–36.0)
MCV: 86.1 fL (ref 78.0–100.0)
Platelets: 191 10*3/uL (ref 150–400)
RBC: 3.45 MIL/uL — ABNORMAL LOW (ref 4.22–5.81)
RDW: 17.6 % — AB (ref 11.5–15.5)
WBC: 5.7 10*3/uL (ref 4.0–10.5)

## 2013-09-08 LAB — GLUCOSE, CAPILLARY
GLUCOSE-CAPILLARY: 115 mg/dL — AB (ref 70–99)
Glucose-Capillary: 125 mg/dL — ABNORMAL HIGH (ref 70–99)
Glucose-Capillary: 124 mg/dL — ABNORMAL HIGH (ref 70–99)
Glucose-Capillary: 184 mg/dL — ABNORMAL HIGH (ref 70–99)

## 2013-09-08 LAB — BASIC METABOLIC PANEL
BUN: 10 mg/dL (ref 6–23)
CHLORIDE: 104 meq/L (ref 96–112)
CO2: 29 mEq/L (ref 19–32)
CREATININE: 0.86 mg/dL (ref 0.50–1.35)
Calcium: 8.4 mg/dL (ref 8.4–10.5)
GFR calc Af Amer: 85 mL/min — ABNORMAL LOW (ref >90)
GFR calc non Af Amer: 73 mL/min — ABNORMAL LOW (ref >90)
Glucose, Bld: 93 mg/dL (ref 70–99)
Potassium: 4.1 mEq/L (ref 3.7–5.3)
Sodium: 143 mEq/L (ref 137–147)

## 2013-09-08 MED ORDER — HYDROCODONE-ACETAMINOPHEN 5-325 MG PO TABS
1.0000 | ORAL_TABLET | Freq: Two times a day (BID) | ORAL | Status: AC | PRN
Start: 2013-09-08 — End: ?

## 2013-09-08 MED ORDER — DEXTROSE 5 % IV SOLN
1.0000 g | INTRAVENOUS | Status: DC
  Administered 2013-09-08 – 2013-09-09 (×2): 1 g via INTRAVENOUS
  Filled 2013-09-08 (×2): qty 10

## 2013-09-08 NOTE — Progress Notes (Signed)
09/08/13 Received call from Dr. Heber Rentz, patient will need home IV  antibx, will need PICC. Discharge will be delayed to 09/09/13. Informed Mr. Weekly and his son of change in d/c plan. They are agreeable to d/c on 09/09/13 and home IV antibx. Fuller Plan RN, BSN, CCM  09/08/13 Informed by CSW that patient's son Kiara Keep wishes to take patient home with [facility] rather than SNF. Spoke with Dr. Heber [place], patient to have [facility], PT, OT, aide and social worker. [place] with patient, he is agreeable to going home with his son and for his son to decide on [facility] agency. Contactecd Mr Lennell Shanks, he would like [facility]. He also requested a 3N1, hospital bed, and a wheelchair cushion.He stated that the patient has a walker and wheelchair at home.The patient will be at the son's home [address] [place], [place] [address].Spoke with Dr. Doran Heater will order home O2,3N1 and w/c cushion, not hosp bed at this time. Contacted Yakir Wenke at [facility] they will be able to provide [facility], PT, OT,  aide and SW. Contacted Jermaine at [facility] they will provide 3N1, home O2, and w/c cushion.

## 2013-09-08 NOTE — Clinical Social Work Note (Signed)
Son states that he wants to take patient home unless patient can get into [facility] or [facility]. [facility] has said no, and Miquel Dunn will give me an answer by 2:30pm this afternoon. Son informed and understands that he will be taking patient home at discharge if we do not hear from [facility]. Patient's son verbalized understanding and agreement.   Liz Beach MSW, [place], [place], [phone]

## 2013-09-08 NOTE — Progress Notes (Signed)
Subjective: Patient has remained at 96-98% O2 on 2L .  Remains afebrile.  He reports no fever, chills, cough, SOB, abdominal pain, urethral discomfort, weakness, or back pain. Son at bedside this AM.  We discussed with son that we do not suspect PNA at this time and that a CTA may yield more diagnostic information but doubt it would show anything to intervene on (Not candidate for A/C, if mets to lungs would be very poor prognosis).  They replied that they would not want a CT at this time.  We also discussed his poor prognosis if he were to have a cardiac arrest given his mets to ribs.  They have come to the decision to have a DNR order. Objective: Vital signs in last 24 hours: Filed Vitals:   09/07/13 0507 09/07/13 0518 09/07/13 2050 09/08/13 0524  BP:   132/78 126/66  Pulse:   93 78  Temp:   98.2 F (36.8 C) 98.6 F (37 C)  TempSrc:   Oral Oral  Resp:   20 20  Height:  5\' 7"  (1.702 m)    Weight:  124 lb 8 oz (56.473 kg)    SpO2: 92%  96% 98%   Weight change:   Intake/Output Summary (Last 24 hours) at 09/08/13 0724 Last data filed at 09/08/13 0524  Gross per 24 hour  Intake    666 ml  Output    820 ml  Net   -154 ml   General: resting in bed , foley to gravity with yellow urine Cardiac: RRR, no murmurs Pulm: CTAB, no rales appreciated, mildly decreased basilar breath sounds Abd: soft, nontender, nondistended, BS present Ext: warm and well perfused, no pedal edema  Lab Results: Basic Metabolic Panel:  Recent Labs Lab 09/07/13 0130  NA 139  K 3.2*  CL 99  CO2 26  GLUCOSE 137*  BUN 11  CREATININE 0.81  CALCIUM 8.4   Liver Function Tests:  Recent Labs Lab 09/07/13 0130  AST 18  ALT <5  ALKPHOS 257*  BILITOT 0.9  PROT 6.8  ALBUMIN 2.5*   No results found for this basename: LIPASE, AMYLASE,  in the last 168 hours No results found for this basename: AMMONIA,  in the last 168 hours CBC:  Recent Labs Lab 09/07/13 0130  WBC 5.6  NEUTROABS 4.1  HGB  11.0*  HCT 34.8*  MCV 83.3  PLT 209   Cardiac Enzymes:  Recent Labs Lab 09/07/13 0130  TROPONINI <0.30   BNP:  Recent Labs Lab 09/07/13 0130  PROBNP 3162.0*   D-Dimer: No results found for this basename: DDIMER,  in the last 168 hours CBG:  Recent Labs Lab 09/07/13 0808 09/07/13 1200 09/07/13 1716 09/07/13 2211  GLUCAP 163* 107* 121* 146*   Hemoglobin A1C: No results found for this basename: HGBA1C,  in the last 168 hours Fasting Lipid Panel: No results found for this basename: CHOL, HDL, LDLCALC, TRIG, CHOLHDL, LDLDIRECT,  in the last 168 hours Thyroid Function Tests: No results found for this basename: TSH, T4TOTAL, FREET4, T3FREE, THYROIDAB,  in the last 168 hours Coagulation:  Recent Labs Lab 09/07/13 0130  LABPROT 15.1  INR 1.22   Anemia Panel: No results found for this basename: VITAMINB12, FOLATE, FERRITIN, TIBC, IRON, RETICCTPCT,  in the last 168 hours Urine Drug Screen: Drugs of Abuse  No results found for this basename: labopia,  cocainscrnur,  labbenz,  amphetmu,  thcu,  labbarb    Alcohol Level: No results found for this basename:  [place],  in the last 168 hours Urinalysis:  Recent Labs Lab 09/07/13 0030  COLORURINE RED*  LABSPEC 1.019  PHURINE 5.0  GLUCOSEU NEGATIVE  HGBUR LARGE*  BILIRUBINUR LARGE*  KETONESUR 40*  PROTEINUR >300*  UROBILINOGEN 1.0  NITRITE POSITIVE*  LEUKOCYTESUR LARGE*   Micro Results: Recent Results (from the past 240 hour(s))  MRSA PCR SCREENING     Status: None   Collection Time    09/07/13  7:10 AM      Result Value Ref Range Status   MRSA by PCR NEGATIVE  NEGATIVE Final   Comment:            The GeneXpert MRSA Assay (FDA     approved for NASAL specimens     only), is one component of a     comprehensive MRSA colonization     surveillance program. It is not     intended to diagnose MRSA     infection nor to guide or     monitor treatment for     MRSA infections.   Studies/Results: Dg Chest Port 1  View  09/07/2013   CLINICAL DATA:  Low O2 sats.  Hypoxia.  EXAM: PORTABLE CHEST - 1 VIEW  COMPARISON:  08/13/2013.  FINDINGS: Cardiomegaly. Bilateral pulmonary opacities have worsened since the previous radiograph, possible bilateral pneumonia versus atypical pulmonary edema. Right greater than left pleural effusions. Slight nodular character to some areas could represent metastatic disease, although this is less favored. Scattered sclerotic lesions of the ribs consistent with metastatic prostate cancer. No pneumothorax.  IMPRESSION: Worsening aeration. Cardiomegaly. Question bilateral pneumonia versus atypical edema. See discussion above.   Electronically Signed   By: Rolla Flatten M.D.   On: 09/07/2013 02:43   Medications: I have reviewed the patient's current medications. Scheduled Meds: . abiraterone Acetate  1,000 mg Oral Daily  . antiseptic oral rinse  15 mL Mouth Rinse BID  . enoxaparin (LOVENOX) injection  40 mg Subcutaneous Q24H  . feeding supplement (ENSURE COMPLETE)  237 mL Oral BID BM  . insulin aspart  0-9 Units Subcutaneous TID WC   Continuous Infusions:  PRN Meds:.HYDROcodone-acetaminophen Assessment/Plan: Acute on Chronic Respiratory failure  - Patient O2 sat has remained 96-98% on 2L [place] (clarified today with son that he was only on 2L prior to sending to hospital.) Will monitor off oxygen. - DDx PE, Mets to Lung, PNA, doubt acute left heart failure causing atypical edema - Patient without fever, leukocytosis to suggest PNA, Vanc Cefepime D/C yesterday.  Will continue Tx for UTI with ceftriaxone.  - Not a candidate for A/C or filter if PE and VSS. - Patient shows no systemic signs of volume overload (and weight down 6lbs since last discharge 2 weeks ago)  He does have bilateral pleural effusions and worsened arenation. - I am concerned for possible metastasis of his prostate CA to his lungs, he and his family do not want aggressive care for this and do not want a CT scan to aid in  diagnosis.  If it is lung mets he would have a very poor prognosis.  They are interested in possible hospice and a palliative care consult (outpatient). - Patient can likely be discharged back to SNF today.  Son does not want to go to [facility], if cannot be placed to other facility would like for him to be discharged home. Reports he would have 24 hour care at home.    Metastatic Prostate cancer - Patient follows with [facility].  Currently complains  of no pain.  Patient and son are open to speaking with palliative care which could be arranged in SNF. - Zytiga not on hospital formulary. Will hold off on ordering it. -Norco PRN - Cannot rule out lung mets.  Patient and son interested in returning home and interested in palliative care consult with possible entry into hospice care.  Will try to see if this can be arranged as outpatient.    Hematuria - Hematuria has resolved.  Likely secondary to known prostate CA.    UTI- catheter associated - Received Vanc + Cefepime yesterday - WIll change to Ceftriaxone today (will need to reconsider if going home instead of SNF) - Replace Foley catheter and Obtain Urine Culture (culture not obtained at [facility], d/t EPIC downtime). If nursing unable to do will need to consult Urology    DM (diabetes mellitus) - SSI-S - Hold nateglinide  Hypokalemia-resolved - Repleted  Severe Protein Calorie Malnutrition in context of chronic illness -Ensure complete BID - Regular diet  Diet: Regular DVT: Lovenox Code Status: DNR  Dispo: Discharge pending SNF placement.   The patient does have a current PCP Wenda Low, MD) and does not need an [facility] hospital follow-up appointment after discharge.  The patient does not have transportation limitations that hinder transportation to clinic appointments.  .Services Needed at time of discharge: Y = Yes, Blank = No PT: y if return home  OT:   RN:   Equipment:   Other:     LOS: 1 day   Joni Reining,  DO 09/08/2013, 7:24 AM

## 2013-09-08 NOTE — Plan of Care (Signed)
Problem: Phase I Progression Outcomes Goal: Voiding-avoid urinary catheter unless indicated Outcome: Not Met (add Reason) Chronic catheter requirement

## 2013-09-08 NOTE — Progress Notes (Signed)
SATURATION QUALIFICATIONS: (This note is used to comply with regulatory documentation for home oxygen)  Patient Saturations on Room Air at Rest = 82%  Patient Saturations on Room Air while Ambulating = N/A  Patient Saturations on N/A Liters of oxygen while Ambulating = N/A  Please briefly explain why patient needs home oxygen:

## 2013-09-08 NOTE — Progress Notes (Signed)
  Date: 09/08/2013  Patient name: Brandon Harrell  Medical record number: [id number]  Date of birth: 1920-03-30   This patient has been seen and the plan of care was discussed with the house staff. Please see their note for complete details. I concur with their findings with the following additions/corrections: Mr Hanover son was present today. Dr Heber [employer] dicussed plans with the son and documented the decisions in his note - DNR, No CT scan, SNF with hospice if possible, otherwise home with hospice. D/C today once we determine is he has SNF bed offer.   Bartholomew Crews, MD 09/08/2013, 12:23 PM

## 2013-09-08 NOTE — Progress Notes (Signed)
SATURATION QUALIFICATIONS: (This note is used to comply with regulatory documentation for home oxygen)  Patient Saturations on Room Air at Rest = 82% went up to 92-94% on 2L of O2  Patient Saturations on Room Air while Ambulating = N/A  Patient Saturations on N/A Liters of oxygen while Ambulating = N/A  Please briefly explain why patient needs home oxygen:

## 2013-09-08 NOTE — Clinical Social Work Note (Signed)
Patient's son plans to take patient home as Brandon Harrell and [employer] both denied patient admission. RNCM notified. Patient's son will transport patient.   Liz Beach MSW, [place], [place], [phone]

## 2013-09-08 NOTE — Clinical Social Work Psychosocial (Signed)
Clinical Social Work Department BRIEF PSYCHOSOCIAL ASSESSMENT 09/08/2013  Patient:  Brandon Harrell,Brandon Harrell     Account Number:  [id number]     Admit date:  09/07/2013  Clinical Social Worker:  CAMPBELL,JOSEPH BRYANT, LCSWA  Date/Time:  09/08/2013 10:28 AM  Referred by:  Physician  Date Referred:  09/08/2013 Referred for  SNF Placement   Other Referral:   Interview type:  Patient Other interview type:   Patient and son were interviewed to complete assessment.    PSYCHOSOCIAL DATA Living Status:  FAMILY Admitted from facility:  [facility] Level of care:  Skilled Nursing Facility Primary support name:  Brandon Harrell Primary support relationship to patient:  CHILD, ADULT Degree of support available:   Support is good.    CURRENT CONCERNS Current Concerns  Post-Acute Placement   Other Concerns:    SOCIAL WORK ASSESSMENT / PLAN CSW met with patient at bedside to complete assessment. Patient states that he is from [facility] and understands that his family's plan is for him to return at [place]. Patient asked that CSW speak with patient's son. Patient's son Brandon Harrell states that he is undecided about returning to [facility] or bringing patient home. CSW asked that son let CSW know by 12:00 pm today. Son is agreeable to this and will call CSW back. Patient and son are aware and familiar with SNF placement process and all questions were answered. Patient seems in good spirits and is happy to return to facility or return home.   Assessment/plan status:  Psychosocial Support/Ongoing Assessment of Needs Other assessment/ plan:   Complete FL2, Fax, Blue Medicare Auth   Information/referral to community resources:   CSW contact information given.    PATIENT'S/FAMILY'S RESPONSE TO PLAN OF CARE: Patient and patient's son still unsure about return to [facility] or returning home at discharge. Patient's son will give CSW decision by 12:00pm. Patient and son were engaged in  assessment. CSW will assist with [place].       Bryant Campbell MSW, LCSWA, LCASA, [phone] 

## 2013-09-08 NOTE — Discharge Summary (Signed)
Name: Brandon Harrell MRN: [id number] DOB: 01/23/1920 80 y.o. PCP: Wenda Low, MD  Date of Admission: 09/07/2013  1:47 AM Date of Discharge: 09/09/2013 Attending Physician: Bartholomew Crews, MD  Discharge Diagnosis: Principal Problem:   Hematuria Active Problems:   UTI (lower urinary tract infection)   Prostate cancer metastatic to multiple sites   DM (diabetes mellitus)   Severe malnutrition   Acute-on-chronic respiratory failure  Discharge Medications:   Medication List         feeding supplement (ENSURE COMPLETE) Liqd  Take 237 mLs by mouth 2 (two) times daily between meals.     HYDROcodone-acetaminophen 5-325 MG per tablet  Commonly known as:  NORCO/VICODIN  Take 1 tablet by mouth 2 (two) times daily as needed for moderate pain.     levofloxacin 750 MG tablet  Commonly known as:  LEVAQUIN  Take 1 tablet (750 mg total) by mouth daily.  Start taking on:  09/10/2013     ZYTIGA 250 MG tablet  Generic drug:  abiraterone Acetate  Take 1,000 mg by mouth daily. Take on an empty stomach 1 hour before or 2 hours after a meal        Disposition and follow-up:   Mr.Brandon Harrell was discharged from [facility] in Stable condition.  At the hospital follow up visit please address:  1.  Has [facility] contacted patient?    2. Evaluate for further urinary symptoms.  Would not restart A/C due to repeat Hematuria.  Patient discharged with 5 additional days of Levaquin.  May need follow up with Urology.  3.  Revaluate respiratory status.  Patient on 2L home O2  4.  Labs / imaging needed at time of follow-up: None  5.  Pending labs/ test needing follow-up: Urine Culture  Follow-up Appointments: Follow-up Information   Follow up with [facility]. (nurse, physical therapy, occupational therapy, aide and social worker)    Contact information:   [address] [place] [address] [phone]       Follow up with Juluis Mire,  MD On 09/13/2013. (at 9:15am for hosptial follow up)    Specialty:  Internal Medicine   Contact information:   [address] [place] [address] [phone]       Discharge Instructions: Discharge Instructions   Diet - low sodium heart healthy    Complete by:  As directed      Discharge instructions    Complete by:  As directed   Please take Levaquin 750mg  daily for 5 days, start tomorrow.     Increase activity slowly    Complete by:  As directed            Consultations:    Procedures Performed:    Dg Chest Port 1 View  09/07/2013   CLINICAL DATA:  Low O2 sats.  Hypoxia.  EXAM: PORTABLE CHEST - 1 VIEW  COMPARISON:  08/13/2013.  FINDINGS: Cardiomegaly. Bilateral pulmonary opacities have worsened since the previous radiograph, possible bilateral pneumonia versus atypical pulmonary edema. Right greater than left pleural effusions. Slight nodular character to some areas could represent metastatic disease, although this is less favored. Scattered sclerotic lesions of the ribs consistent with metastatic prostate cancer. No pneumothorax.  IMPRESSION: Worsening aeration. Cardiomegaly. Question bilateral pneumonia versus atypical edema. See discussion above.   Electronically Signed   By: Rolla Flatten M.D.   On: 09/07/2013 02:43   Admission HPI: Brandon Harrell is a 80 y.o. man with a pmhx  of metastatic prostate cancer, and DM who presents with a cc of hematuria. The patient was in his normal state of health until yesterday when the nursing staff at the facility noticed gross hematuria in his foley catheter. The patient has no complaints of dysuria or suprapubic pain at this time. Further, he is on 2 L by [place] as needed at the facility. On the day prior to admission he was found to have desaturated to the low 80's on 2 L [place]. He responded well to increasing his oxygen requirement to 4 L [place]. At the [facility] ED, he was found to have a UA concerning for UTI and CXR concerning for PNA versus atypical pulmonary  edema. As he was recently discharged from [facility], [facility] was consulted for admission.  Of note, the patient was admitted for SBO. This resolved with conservative management. He was also treated for UTI for 4 days of ceftriaxone. However, urine culture failed to grow bacteria and it was d/c after 4 days. The patient also had frank hematuria during this admission. Urology was consulted who felt this to be 2/2 metastatic prostate cancer and placed a foley catheter, which was planed to remain in place for 4 weeks. Dr. Louis Meckel at [facility] planned to manage the patients hematuria as an outpatient.  The patient has no complaints at this time. He denies cough, SOB, fever, chills, suprapubic tenderness, dysuria.    Hospital Course by problem list: Acute on Chronic Respiratory failure  - Patient presented with Hematuria and was found to be hypoxic in the ED.  Initialy chest Xray suggestive of atypical edema versus  PNA versus lung mets.  He was without fever or leukocytosis but initially treated in the ED for possible HCAP with Vanc/Zosyn.  This was deescalated on the Floor to Van/ Cefepime. On hospital day 1 his Abx were changed to Ceftriaxone (to treat UTI) as PNA was deemed less likely.  There was concern for PE versus lung mets as a cause but given patient was not a candidate for anticoagulation (repeat hematuria).  We discussed obtaining a CTA with patient and son for further diagnostic clarification however given lack of options for treating PE or possible lung Metastasis this was declined by family.  CHF causing atypical edema was not favored given he did not appear volume overloaded (although a proBNP was elevated.  Without diuresis or further treatment the patient's O2 sat has remained 96-98% on 2L [place] which is the amount he presented on, when decreased to room air his O2 sat decreased to 82%.  After extensive discussions with Son he preferred patient not to return to [facility], and as he was  unable to get an alternate SNF of his choosing he wanted Mr. Fulop to return home.  We helped to arrange home health care at this time.  They are interested in hospice care and [facility] was contacted who will also evaluate patient at home.  He will continue home O2 at 2L via [place].  Metastatic Prostate cancer  - [facility]. Currently complains of no pain. Patient and son are open to speaking with palliative care which could be arranged in SNF. His Zytiga was not on hospital formulary and thus could not be obtained for his short hospital course.  He will resume this as at home however after our discussions they are interested in an outpatient palliative care consult and possible entrance into hospice care.  I am not sure at this time if this  medication will provide palliative relief and I have continued this medication at discharge.  Hematuria  - Hematuria has resolved. Likely secondary to known prostate CA versus UTI - Hgb on admission 11g/dL (possibly hemoconcentrated as discharge hgb 2 weeks prior 8.5 g/dL).  No further bleeding appreciated since admission and discharge Hgb 9.0 g/dL  UTI- catheter associated  - Initially received Vanc/Zosyn in ED, changed to Vanc Cefepime on floor.  On hospital day 2 this was changed to Ceftriaxone.  Due to EPIC (EHR) downtime a Urine culture was not obtained on initial U/A results.  It was obtained on hospital day 1.  He was treated with Ceftriaxone for 2 days we discussed continuing this medication with a PICC line at home with his son as well as the risks associated with a PICC line.  We also discussed the risks of Levaquin as the alternative PO medication given his prolonged QT on EKG.  Son decided patient would prefer not to have another needle stick and would rather have the oral medication.  He will continue this for 5 days.  His catheter was replaced on hospital day 1.  DM (diabetes mellitus)  - Blood glucose was well  controlled with minimal insulin requirement from a sensitive sliding scale.  Hypokalemia - K+ 3.2 on admission, was orally repleted and resolved to 4.1 on discharge.  Severe Protein Calorie Malnutrition in context of chronic illness  -Evaluated by Dietician who recommended addition of Ensure supplements.  He was given a regular diet while inpatient.   DVT PPx: Lovenox Discharge Vitals:   BP 126/76  Pulse 95  Temp(Src) 97.8 F (36.6 C) (Oral)  Resp 20  Ht 5\' 7"  (1.702 m)  Wt 124 lb 8 oz (56.473 kg)  BMI 19.49 kg/m2  SpO2 96%  Discharge Labs:  Results for orders placed during the hospital encounter of 09/07/13 (from the past 24 hour(s))  GLUCOSE, CAPILLARY     Status: Abnormal   Collection Time    09/08/13  9:33 PM      Result Value Ref Range   Glucose-Capillary 184 (*) 70 - 99 mg/dL  GLUCOSE, CAPILLARY     Status: Abnormal   Collection Time    09/09/13  7:47 AM      Result Value Ref Range   Glucose-Capillary 119 (*) 70 - 99 mg/dL  GLUCOSE, CAPILLARY     Status: Abnormal   Collection Time    09/09/13 12:27 PM      Result Value Ref Range   Glucose-Capillary 158 (*) 70 - 99 mg/dL  GLUCOSE, CAPILLARY     Status: Abnormal   Collection Time    09/09/13  4:41 PM      Result Value Ref Range   Glucose-Capillary 146 (*) 70 - 99 mg/dL    Signed: Joni Reining, DO 09/09/2013, 5:49 PM   Time Spent on Discharge: 55 minutes Services Ordered on Discharge: none Equipment Ordered on Discharge: none

## 2013-09-09 DIAGNOSIS — J9620 Acute and chronic respiratory failure, unspecified whether with hypoxia or hypercapnia: Secondary | ICD-10-CM | POA: Diagnosis present

## 2013-09-09 LAB — URINE CULTURE

## 2013-09-09 LAB — GLUCOSE, CAPILLARY
Glucose-Capillary: 119 mg/dL — ABNORMAL HIGH (ref 70–99)
Glucose-Capillary: 158 mg/dL — ABNORMAL HIGH (ref 70–99)
Glucose-Capillary: 146 mg/dL — ABNORMAL HIGH (ref 70–99)

## 2013-09-09 MED ORDER — ENSURE COMPLETE PO LIQD: 237.0000 mL | Freq: Two times a day (BID) | ORAL | Status: AC

## 2013-09-09 MED ORDER — LEVOFLOXACIN 750 MG PO TABS: 750.0000 mg | ORAL_TABLET | Freq: Every day | ORAL | Status: AC

## 2013-09-09 NOTE — Care Management Note (Addendum)
    Page 1 of 2   09/09/2013     4:29:35 PM CARE MANAGEMENT NOTE 09/09/2013  Patient:  Brandon Harrell, Brandon Harrell   Account Number:  [id number]  Date Initiated:  09/08/2013  Documentation initiated by:  Fuller Plan  Subjective/Objective Assessment:   admitted with hematuria, pneumonia     Action/Plan:   PT/OT recommending SNF, family wishes to take patient home with [facility], patient agreeable with returning home   Anticipated DC Date:  09/09/2013   Anticipated DC Plan:  [place]  In-house referral  Clinical Social Worker      DC Forensic scientist  CM consult      [facility] Choice  HOME HEALTH   Choice offered to / List presented to:  C-4 Adult Children   DME arranged  3-N-1  [place]      DME agency  [place] arranged  HH-1 RN  [place]      [place]   Status of service:  Completed, signed off Medicare Important Message given?  NA - LOS <3 / Initial given by admissions (If response is "NO", the following Medicare IM given date fields will be blank) Date Medicare IM given:   Date Additional Medicare IM given:    Discharge Disposition:  [place]  Per UR Regulation:  Reviewed for med. necessity/level of care/duration of stay  If discussed at [place] of Stay Meetings, dates discussed:    Comments:  09/09/13 [place], BSN [phone] NCM spoke with Dr. Doran Heater states they haved decided to send patient on po abx instead.  NCM notified Brandon Harrell with [facility] who is supplying med for Brandon Harrell and she notified Brandon Harrell with Brandon Harrell.  Brandon Harrell is here to drop off dme to son.  [place] is for dc today.  Patient will f/u with internal medicine clinic per Dr. Heber Kerr and he will make the apt.  Patient will go home with hh services with [facility] and will have an outpt referral for hospice  per confirmation with  MD.  09/08/13 Received call from Dr.  Heber [place], patient will need home IV  antibx, will need PICC. Discharge will be delayed to 09/09/13. Informed Brandon Harrell and his son of change in d/c plan. They are agreeable to d/c on 09/09/13 and home IV antibx. Fuller Plan RN, BSN, CCM  09/08/13 Informed by CSW that patient's son Brandon Harrell wishes to take patient home with [facility] rather than SNF. Spoke with Dr. Heber [place], patient to have [facility], PT, OT, aide and social worker. [place] with patient, he is agreeable to going home with his son and for his son to decide on [facility] agency. Contactecd Brandon Harrell, he would like [facility]. He also requested a 3N1, hospital bed, and a wheelchair cushion.He stated that the patient has a walker and wheelchair at home.The patient will be at the son's home [address] [place], [place] [address].Spoke with Dr. Doran Heater will order home O2,3N1 and w/c cushion, not hosp bed at this time. Contacted Brandon Harrell at [facility] they will be able to provide [facility], PT, OT,  aide and SW. Contacted Brandon Harrell they will provide 3N1, home O2, and w/c cushion.

## 2013-09-09 NOTE — Discharge Instructions (Signed)
[facility] will help organize PT, OT, RN, and Aid as well as providing equipment. Mr. Mcmichael will need to take Levaquin 750mg  once a day for 5 days starting tomorrow. Please continue with home oxygen at 2L. Please come to your follow up appointment on 6/16. [facility] will contact you.

## 2013-09-09 NOTE — Progress Notes (Signed)
Physical Therapy Treatment Patient Details Name: Brandon Harrell MRN: [id number] DOB: [birth year] Today's Date: 09/09/2013    History of Present Illness 80 year old male with a PMH of DM and prostate CA who was admitted 5/15 with SBO, returns 6/10 with hematuria, likely result of prostate CA, and HCAP.    PT Comments    Pt very pleasant singing and reciting poetry this morning. Pt unaware of need to void at rest but able to as soon as standing. Pt benefits from protective underwear with mobility. Sats also dropping to 85% on RA and maintained 97% on 2L with gait. Pt educated for transfers, gait and function. Son present end of session and aware of pt mobility. Will continue to follow.   Follow Up Recommendations  Home health PT;Supervision/Assistance - 24 hour     Equipment Recommendations       Recommendations for Other Services       Precautions / Restrictions Precautions Precautions: Fall Precaution Comments: incontinent of stool, check sats Restrictions Weight Bearing Restrictions: No    Mobility  Bed Mobility Overal bed mobility: Needs Assistance Bed Mobility: Supine to Sit     Supine to sit: Min guard     General bed mobility comments: cues for sequence with assist of rail and increased time  Transfers Overall transfer level: Needs assistance   Transfers: Sit to/from Stand;Stand Pivot Transfers Sit to Stand: Min assist Stand pivot transfers: Min assist       General transfer comment: cues for hand placement, anterior weight shift, sequence and stability. Transferred bed to [facility] initially to prevent stool incontinence with gait with use of pad and underwear for ambulation  Ambulation/Gait Ambulation/Gait assistance: Min guard Ambulation Distance (Feet): 200 Feet Assistive device: Rolling walker (2 wheeled) Gait Pattern/deviations: Step-through pattern;Decreased stride length;Trunk flexed     General Gait Details: cues for posture, position in RW and directional  cues, pt with bil knee flexion throughout   Stairs            Wheelchair Mobility    Modified Rankin (Stroke Patients Only)       Balance Overall balance assessment: Needs assistance   Sitting balance-Leahy Scale: Fair       Standing balance-Leahy Scale: Poor                      Cognition Arousal/Alertness: Awake/alert Behavior During Therapy: WFL for tasks assessed/performed Overall Cognitive Status: History of cognitive impairments - at baseline       Memory: Decreased short-term memory              Exercises      General Comments        Pertinent Vitals/Pain HR 102 No pain    Home Living                      Prior Function            PT Goals (current goals can now be found in the care plan section) Progress towards PT goals: Progressing toward goals    Frequency  Min 2X/week    PT Plan Discharge plan needs to be updated    Co-evaluation             End of Session Equipment Utilized During Treatment: Gait belt;Oxygen Activity Tolerance: Patient tolerated treatment well Patient left: in chair;with call bell/phone within reach;with chair alarm set;with family/visitor present     Time: 6948-5462 PT Time Calculation (min): 25  min  Charges:  $Gait Training: 8-22 mins $Therapeutic Activity: 8-22 mins                    G Codes:      Melford Aase 09-15-2013, 9:02 AM Elwyn Reach, [place]

## 2013-09-09 NOTE — Progress Notes (Signed)
Nsg Discharge Note  Admit Date:  09/07/2013 Discharge date: 09/09/2013   Brandon Harrell to be D/C'd Home per MD order.  AVS completed.  Copy for chart, and copy for patient signed, and dated. Patient/caregiver able to verbalize understanding.  Discharge Medication:   Medication List         feeding supplement (ENSURE COMPLETE) Liqd  Take 237 mLs by mouth 2 (two) times daily between meals.     HYDROcodone-acetaminophen 5-325 MG per tablet  Commonly known as:  NORCO/VICODIN  Take 1 tablet by mouth 2 (two) times daily as needed for moderate pain.     levofloxacin 750 MG tablet  Commonly known as:  LEVAQUIN  Take 1 tablet (750 mg total) by mouth daily.  Start taking on:  09/10/2013     ZYTIGA 250 MG tablet  Generic drug:  abiraterone Acetate  Take 1,000 mg by mouth daily. Take on an empty stomach 1 hour before or 2 hours after a meal        Discharge Assessment: Filed Vitals:   09/09/13 1352  BP: 126/76  Pulse: 95  Temp: 97.8 F (36.6 C)  Resp: 20   Skin clean, dry and intact without evidence of skin break down, no evidence of skin tears noted. IV catheter discontinued intact. Site without signs and symptoms of complications - no redness or edema noted at insertion site, patient denies c/o pain - only slight tenderness at site.  Dressing with slight pressure applied.  D/c Instructions-Education: Discharge instructions given to patient/family with verbalized understanding. D/c education completed with patient/family including follow up instructions, medication list, d/c activities limitations if indicated, with other d/c instructions as indicated by MD - patient able to verbalize understanding, all questions fully answered. Patient instructed to return to ED, call 911, or call MD for any changes in condition.  Patient escorted via [place], and D/C home via private auto.  Arnetia Bronk, Elissa Hefty, RN 09/09/2013 7:51 PM

## 2013-09-09 NOTE — Progress Notes (Signed)
Subjective: Patient with no complaints.  Remains afebrile, O2 sat 96% on 2L [place].  Son at bedside, discussed risks of Levaquin in the setting of Prolonged QT versus PICC line placement with Ceftriaxone in setting of malignancy (hypercoag).  Son does not want to cause more pain with PICC line and would prefer Levaquin. Objective: Vital signs in last 24 hours: Filed Vitals:   09/08/13 1425 09/08/13 2134 09/09/13 0447 09/09/13 0843  BP: 113/51 120/67 144/78   Pulse: 111 97 94   Temp: 98.2 F (36.8 C) 98.3 F (36.8 C) 97.6 F (36.4 C) 106 F (41.1 C)  TempSrc: Oral Oral Oral   Resp: 18 20 20    Height:      Weight:      SpO2: 82% 96% 96% 97%   Weight change:  No intake or output data in the 24 hours ending 09/09/13 1239 General: resting in bed , foley to gravity with yellow urine Cardiac: RRR, 2/6 systolic murmur over mitral area Pulm: CTAB, no rales appreciated, mildly decreased basilar breath sounds Abd: soft, nontender, nondistended, BS present Ext: warm and well perfused, no pedal edema  Lab Results: Basic Metabolic Panel:  Recent Labs Lab 09/07/13 0130 09/08/13 0643  NA 139 143  K 3.2* 4.1  CL 99 104  CO2 26 29  GLUCOSE 137* 93  BUN 11 10  CREATININE 0.81 0.86  CALCIUM 8.4 8.4   Liver Function Tests:  Recent Labs Lab 09/07/13 0130  AST 18  ALT <5  ALKPHOS 257*  BILITOT 0.9  PROT 6.8  ALBUMIN 2.5*   No results found for this basename: LIPASE, AMYLASE,  in the last 168 hours No results found for this basename: AMMONIA,  in the last 168 hours CBC:  Recent Labs Lab 09/07/13 0130 09/08/13 0643  WBC 5.6 5.7  NEUTROABS 4.1  --   HGB 11.0* 9.0*  HCT 34.8* 29.7*  MCV 83.3 86.1  PLT 209 191   Cardiac Enzymes:  Recent Labs Lab 09/07/13 0130  TROPONINI <0.30   BNP:  Recent Labs Lab 09/07/13 0130  PROBNP 3162.0*   D-Dimer: No results found for this basename: DDIMER,  in the last 168 hours CBG:  Recent Labs Lab 09/08/13 0807 09/08/13 1211  09/08/13 1737 09/08/13 2133 09/09/13 0747 09/09/13 1227  GLUCAP 115* 125* 124* 184* 119* 158*   Hemoglobin A1C: No results found for this basename: HGBA1C,  in the last 168 hours Fasting Lipid Panel: No results found for this basename: CHOL, HDL, LDLCALC, TRIG, CHOLHDL, LDLDIRECT,  in the last 168 hours Thyroid Function Tests: No results found for this basename: TSH, T4TOTAL, FREET4, T3FREE, THYROIDAB,  in the last 168 hours Coagulation:  Recent Labs Lab 09/07/13 0130  LABPROT 15.1  INR 1.22   Anemia Panel: No results found for this basename: VITAMINB12, FOLATE, FERRITIN, TIBC, IRON, RETICCTPCT,  in the last 168 hours Urine Drug Screen: Drugs of Abuse  No results found for this basename: labopia,  cocainscrnur,  labbenz,  amphetmu,  thcu,  labbarb    Alcohol Level: No results found for this basename: ETH,  in the last 168 hours Urinalysis:  Recent Labs Lab 09/07/13 0030  COLORURINE RED*  LABSPEC 1.019  PHURINE 5.0  GLUCOSEU NEGATIVE  HGBUR LARGE*  BILIRUBINUR LARGE*  KETONESUR 40*  PROTEINUR >300*  UROBILINOGEN 1.0  NITRITE POSITIVE*  LEUKOCYTESUR LARGE*   Micro Results: Recent Results (from the past 240 hour(s))  MRSA PCR SCREENING     Status: None   Collection  Time    09/07/13  7:10 AM      Result Value Ref Range Status   MRSA by PCR NEGATIVE  NEGATIVE Final   Comment:            The GeneXpert MRSA Assay (FDA     approved for NASAL specimens     only), is one component of a     comprehensive MRSA colonization     surveillance program. It is not     intended to diagnose MRSA     infection nor to guide or     monitor treatment for     MRSA infections.  URINE CULTURE     Status: None   Collection Time    09/08/13 11:35 AM      Result Value Ref Range Status   Specimen Description URINE, CATHETERIZED   Final   Special Requests NONE   Final   Culture  Setup Time     Final   Value: 09/08/2013 18:13     Performed at [place]      Final   Value: 50,000 COLONIES/ML     Performed at [facility]   Culture     Final   Value: Y     Performed at [facility]   Report Status 09/09/2013 FINAL   Final   Studies/Results: No results found. Medications: I have reviewed the patient's current medications. Scheduled Meds: . antiseptic oral rinse  15 mL Mouth Rinse BID  . cefTRIAXone (ROCEPHIN)  IV  1 g Intravenous Q24H  . enoxaparin (LOVENOX) injection  40 mg Subcutaneous Q24H  . feeding supplement (ENSURE COMPLETE)  237 mL Oral BID BM  . insulin aspart  0-9 Units Subcutaneous TID WC   Continuous Infusions:  PRN Meds:.HYDROcodone-acetaminophen Assessment/Plan: Acute on Chronic Respiratory failure  - Patient still shows no signs of infxn (Temp the AM incorrectly entered as 106 deg).  We cannot completely rule out Metastasis to lungs or Pulmonary embolism.  After extensive conversations with son he has elected not to prusue a CTA which would mainly be for diagnostic and prognostic purposes as there would be few theraputic benefits.  He would like to maximize his comfort and return home with him.  We will discharge home on home Oxygen with home health care.  Per sons wishes I have also placed a call to [facility] who will contact the son.  - Patient without PCP will have him follow up in [facility] on 6/16.    Metastatic Prostate cancer - [facility].  Currently complains of no pain.  Patient and son are open to speaking with palliative care which could be arranged in SNF. - Zytiga not on hospital formulary. Will hold off on ordering it. -Norco PRN - Referral to hospice    Hematuria - Hematuria has resolved.  Likely secondary to known prostate CA.    UTI- catheter associated - Foley replaced, will change Abx to lovenox and Rx 5 additional days for total course of 7 days.    DM (diabetes mellitus) - SSI-S - Hold nateglinide  Severe Protein Calorie Malnutrition in context  of chronic illness -Ensure complete BID - Regular diet  Diet: Regular DVT: Lovenox Code Status: DNR  Dispo: Discharge pending SNF placement.   The patient does not have transportation limitations that hinder transportation to clinic appointments.  .Services Needed at time of discharge: Y = Yes, Blank = No PT: y if  return home  OT:   RN:   Equipment:   Other:     LOS: 2 days   Joni Reining, DO 09/09/2013, 12:39 PM

## 2013-09-09 NOTE — Consult Note (Signed)
Pharmacy student rounding with Internal Medicine B1 [place] [facility]. This morning the medical team discussed transitioning patient to PO antibiotics, particularly levofloxacin. There were concerns raised regarding the QTc prolonging potential of fluroquinolones. According to [employer] , the incidence of QT prolongation with levofloxacin is <1%. Further review of the patients current medication list does not reveal any other medications with additional QTc prolonging effects based upon [employer] electronic drug-drug interaction database.  Vennie Homans, PharmD Candidate Jerald Kief I have reviewed this note and discussed the case with the student. Jorene Guest, PharmD, Professor of Pharmacy, [place]

## 2013-09-09 NOTE — Progress Notes (Signed)
  Date: 09/09/2013  Patient name: Brandon Harrell  Medical record number: [id number]  Date of birth: Jan 14, 1920   This patient has been seen and the plan of care was discussed with the house staff. Please see their note for complete details. I concur with their findings with the following additions/corrections: Mr Hann has no complaints today. He is breathing comfortably with a nl rate. His lungs are clear. He has a loud systolic murmur in the apex, likely c/w mitral regurg. The son is at bedside and Dr Heber Senatobia discussed D/C on Po Levaquin and its risks.  Outpt hospice. F/U [facility].   Bartholomew Crews, MD 09/09/2013, 2:53 PM

## 2013-09-11 NOTE — Care Management (Signed)
5498 09-11-13 late Entry: Chriss Driver- Cherlyn Cushing, RN,BSN, CM received call from RN staff that son was here looking for DME 3n1 and wc. Son had called [facility] and stated no orders were sent to office. CM did call York Cerise at the office and orders faxed for 3n1, wheelchair and cushion. No further needs from CM at this time. Bethena Roys

## 2013-09-13 ENCOUNTER — Ambulatory Visit: Payer: Medicare Other | Admitting: Internal Medicine

## 2014-03-31 DEATH — deceased

## 2016-05-08 IMAGING — CR DG CHEST 2V
2 series · 2 of 2 positions shown · non-contrast
Comparison: August 12, 2013

CLINICAL DATA: Infiltrates.

EXAM:
CHEST  2 VIEW

[w chest lat]
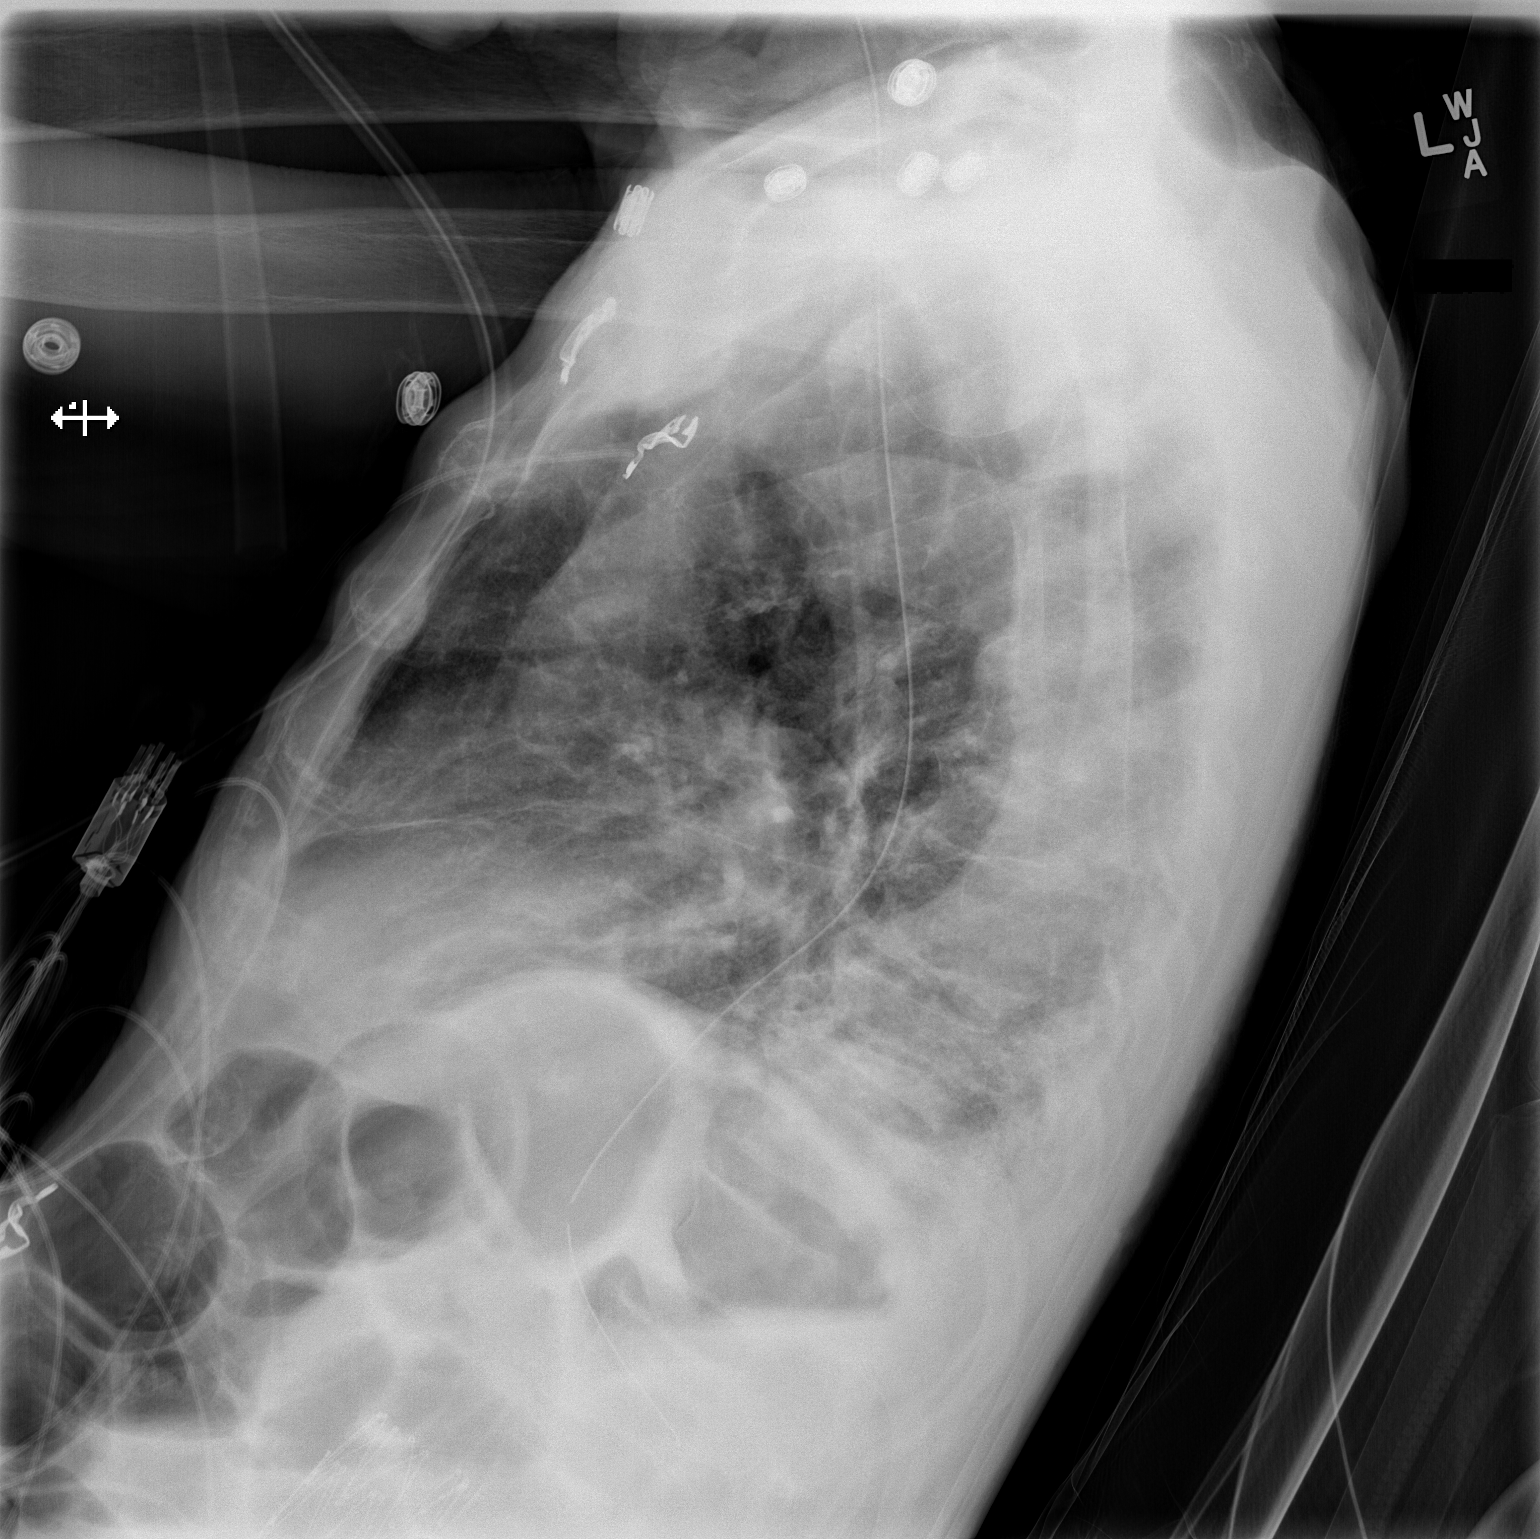

[x chest ap]
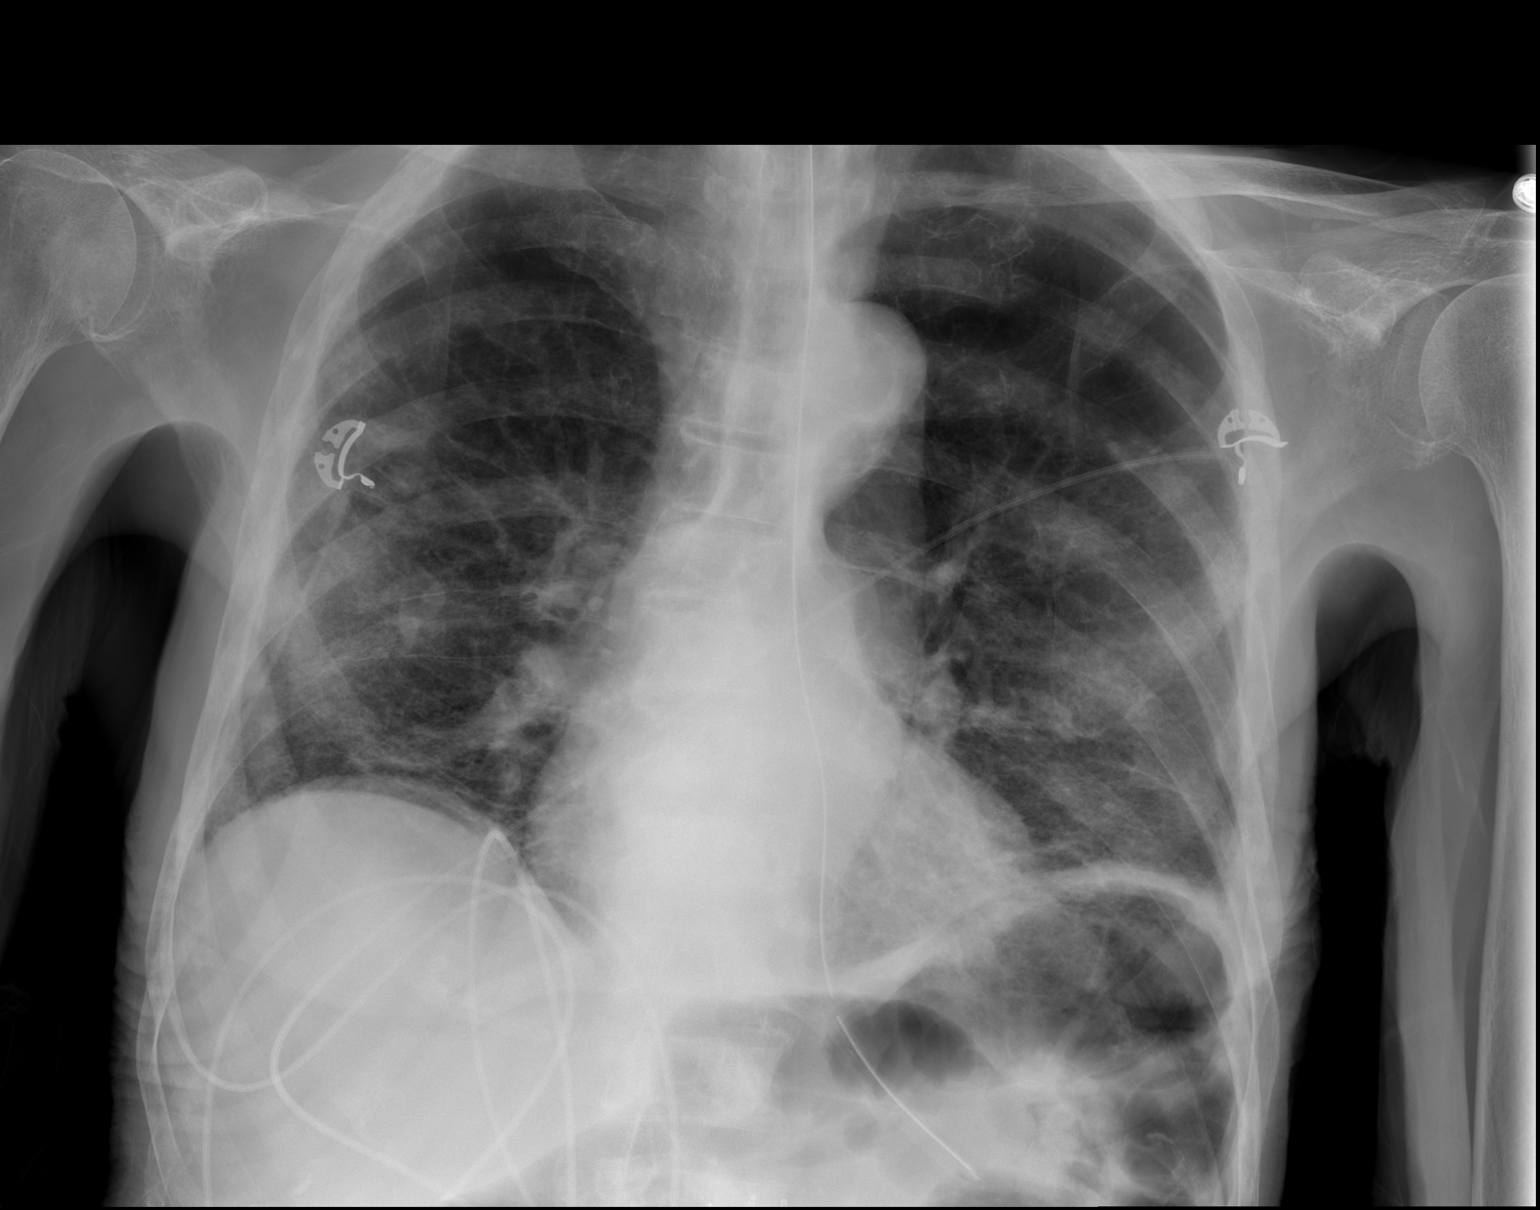

[2 of 2 positions shown; findings below may reference images not displayed]

FINDINGS: The heart size and mediastinal contours are stable. The aorta is
tortuous. There is atelectasis of bilateral lung bases not changed
compared to prior exam. Previously questioned nodularity of the
right mid lung is unchanged. There are small posterior pleural
effusions. Nasogastric tube is noted distal tip in the stomach. The
visualized skeletal structures are stable.
IMPRESSION: Atelectasis of bilateral lung bases not significantly changed
compared to prior exam. Small bilateral posterior pleural effusions.

## 2016-05-08 IMAGING — CR DG ABDOMEN 2V
1 series · 1 of 1 positions shown · non-contrast
Comparison: CT ABD/PELVIS W CM dated 08/12/2013

CLINICAL DATA: Small bowel obstruction

EXAM:
ABDOMEN - 2 VIEW

[w chest decub]
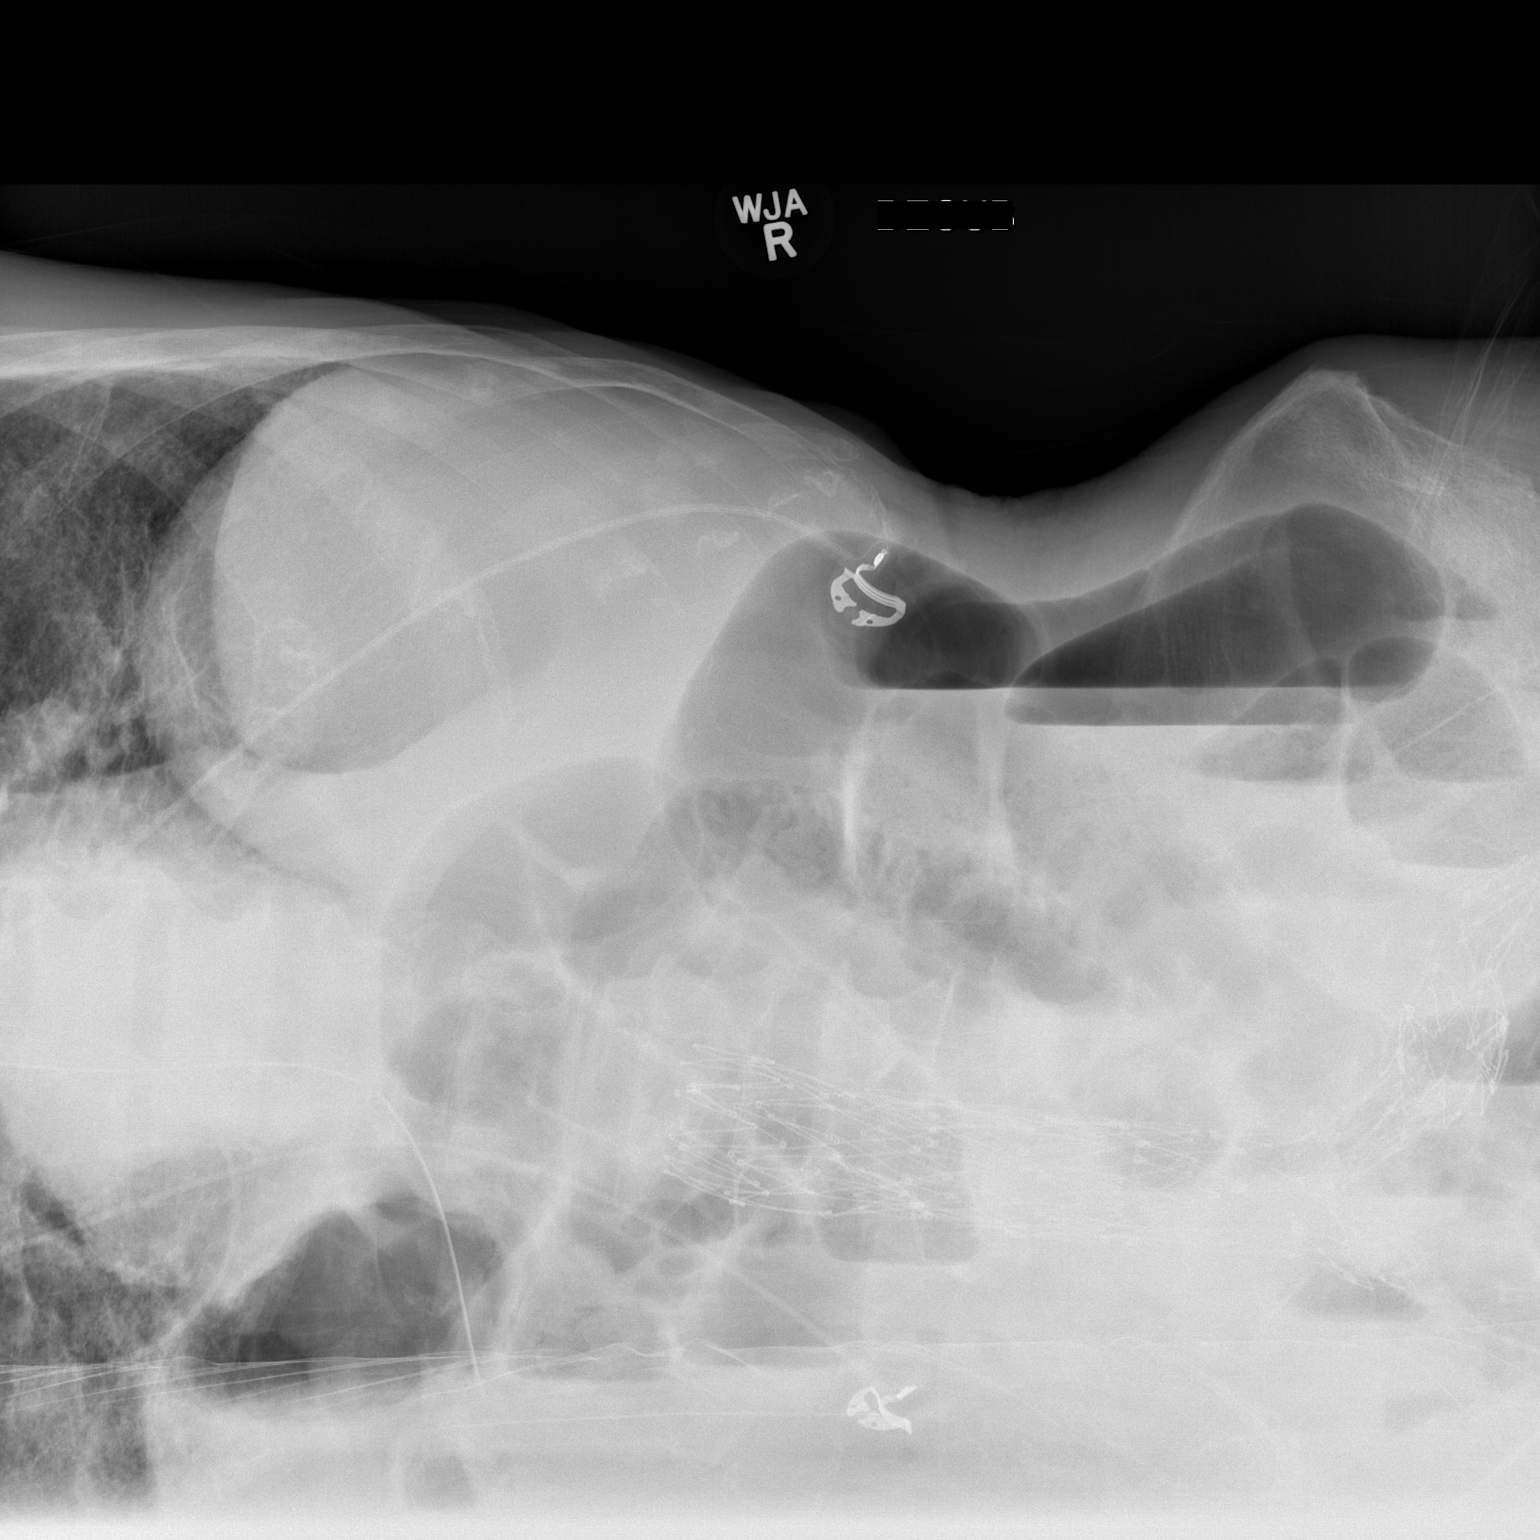

[1 of 1 positions shown; findings below may reference images not displayed]

FINDINGS: Aortic stent graft reidentified. Increase in prominence of numerous
dilated small bowel loops is noted, particularly over the right mid
abdomen. No free air. Nasogastric tube terminates over the expected
location of the stomach. Coils project over the pelvis.
IMPRESSION: Increased dilatation of predominantly right-sided dilated small
bowel loops with persistent overall pattern of small bowel
obstruction. This does carry a risk of bowel ischemia.
